# Patient Record
Sex: Male | Born: 1970 | Race: White | Hispanic: No | Marital: Married | Smoking: Current every day smoker
Health system: Southern US, Community
[De-identification: ages and names within clinical notes are randomized; demographics above are authoritative.]

## PROBLEM LIST (undated history)

## (undated) DIAGNOSIS — K219 Gastro-esophageal reflux disease without esophagitis: Secondary | ICD-10-CM

## (undated) DIAGNOSIS — E7800 Pure hypercholesterolemia, unspecified: Secondary | ICD-10-CM

## (undated) DIAGNOSIS — E119 Type 2 diabetes mellitus without complications: Secondary | ICD-10-CM

## (undated) DIAGNOSIS — I10 Essential (primary) hypertension: Secondary | ICD-10-CM

## (undated) DIAGNOSIS — I219 Acute myocardial infarction, unspecified: Secondary | ICD-10-CM

## (undated) HISTORY — PX: STENT PLACEMENT ILIAC (ARMC HX): HXRAD1735

---

## 1999-02-08 ENCOUNTER — Emergency Department (HOSPITAL_COMMUNITY): Admission: EM | Admit: 1999-02-08 | Discharge: 1999-02-08 | Payer: Self-pay | Admitting: Emergency Medicine

## 1999-02-08 ENCOUNTER — Encounter: Payer: Self-pay | Admitting: Emergency Medicine

## 1999-12-29 ENCOUNTER — Emergency Department (HOSPITAL_COMMUNITY): Admission: EM | Admit: 1999-12-29 | Discharge: 1999-12-29 | Payer: Self-pay | Admitting: Emergency Medicine

## 2005-11-18 ENCOUNTER — Emergency Department (HOSPITAL_COMMUNITY): Admission: EM | Admit: 2005-11-18 | Discharge: 2005-11-18 | Payer: Self-pay | Admitting: Family Medicine

## 2005-11-25 ENCOUNTER — Emergency Department (HOSPITAL_COMMUNITY): Admission: EM | Admit: 2005-11-25 | Discharge: 2005-11-25 | Payer: Self-pay | Admitting: Family Medicine

## 2006-02-10 ENCOUNTER — Ambulatory Visit: Payer: Self-pay | Admitting: Family Medicine

## 2006-02-10 ENCOUNTER — Inpatient Hospital Stay (HOSPITAL_COMMUNITY): Admission: EM | Admit: 2006-02-10 | Discharge: 2006-02-12 | Payer: Self-pay | Admitting: Emergency Medicine

## 2006-02-23 ENCOUNTER — Ambulatory Visit: Payer: Self-pay | Admitting: Sports Medicine

## 2006-02-25 ENCOUNTER — Ambulatory Visit: Payer: Self-pay | Admitting: Family Medicine

## 2006-06-18 ENCOUNTER — Emergency Department (HOSPITAL_COMMUNITY): Admission: EM | Admit: 2006-06-18 | Discharge: 2006-06-18 | Payer: Self-pay | Admitting: Family Medicine

## 2006-09-14 ENCOUNTER — Encounter (INDEPENDENT_AMBULATORY_CARE_PROVIDER_SITE_OTHER): Payer: Self-pay | Admitting: Family Medicine

## 2006-10-12 ENCOUNTER — Encounter (HOSPITAL_COMMUNITY): Admission: RE | Admit: 2006-10-12 | Discharge: 2006-10-19 | Payer: Self-pay | Admitting: Cardiology

## 2006-10-16 ENCOUNTER — Telehealth: Payer: Self-pay | Admitting: Family Medicine

## 2006-10-16 ENCOUNTER — Telehealth: Payer: Self-pay | Admitting: *Deleted

## 2006-10-16 ENCOUNTER — Ambulatory Visit: Payer: Self-pay | Admitting: Family Medicine

## 2006-10-16 DIAGNOSIS — K219 Gastro-esophageal reflux disease without esophagitis: Secondary | ICD-10-CM

## 2006-10-16 LAB — CONVERTED CEMR LAB: Rapid Strep: NEGATIVE

## 2006-10-21 ENCOUNTER — Telehealth: Payer: Self-pay | Admitting: *Deleted

## 2006-10-21 ENCOUNTER — Ambulatory Visit: Payer: Self-pay | Admitting: Family Medicine

## 2006-10-23 ENCOUNTER — Telehealth: Payer: Self-pay | Admitting: *Deleted

## 2006-10-26 ENCOUNTER — Encounter (INDEPENDENT_AMBULATORY_CARE_PROVIDER_SITE_OTHER): Payer: Self-pay | Admitting: Family Medicine

## 2006-10-27 ENCOUNTER — Ambulatory Visit (HOSPITAL_COMMUNITY): Admission: RE | Admit: 2006-10-27 | Discharge: 2006-10-28 | Payer: Self-pay | Admitting: Cardiology

## 2006-11-04 ENCOUNTER — Ambulatory Visit (HOSPITAL_COMMUNITY): Admission: RE | Admit: 2006-11-04 | Discharge: 2006-11-05 | Payer: Self-pay | Admitting: Interventional Cardiology

## 2006-11-16 ENCOUNTER — Encounter (INDEPENDENT_AMBULATORY_CARE_PROVIDER_SITE_OTHER): Payer: Self-pay | Admitting: Family Medicine

## 2006-11-17 ENCOUNTER — Inpatient Hospital Stay (HOSPITAL_COMMUNITY): Admission: AD | Admit: 2006-11-17 | Discharge: 2006-11-18 | Payer: Self-pay | Admitting: Cardiology

## 2006-11-30 ENCOUNTER — Inpatient Hospital Stay (HOSPITAL_COMMUNITY): Admission: AD | Admit: 2006-11-30 | Discharge: 2006-12-01 | Payer: Self-pay | Admitting: Cardiology

## 2006-12-03 ENCOUNTER — Encounter (INDEPENDENT_AMBULATORY_CARE_PROVIDER_SITE_OTHER): Payer: Self-pay | Admitting: Family Medicine

## 2006-12-22 ENCOUNTER — Telehealth (INDEPENDENT_AMBULATORY_CARE_PROVIDER_SITE_OTHER): Payer: Self-pay | Admitting: *Deleted

## 2006-12-22 ENCOUNTER — Ambulatory Visit: Payer: Self-pay | Admitting: Family Medicine

## 2006-12-25 ENCOUNTER — Encounter (INDEPENDENT_AMBULATORY_CARE_PROVIDER_SITE_OTHER): Payer: Self-pay | Admitting: Family Medicine

## 2006-12-28 ENCOUNTER — Encounter: Admission: RE | Admit: 2006-12-28 | Discharge: 2006-12-28 | Payer: Self-pay | Admitting: Cardiology

## 2007-01-18 ENCOUNTER — Encounter (INDEPENDENT_AMBULATORY_CARE_PROVIDER_SITE_OTHER): Payer: Self-pay | Admitting: Family Medicine

## 2007-01-19 ENCOUNTER — Encounter (INDEPENDENT_AMBULATORY_CARE_PROVIDER_SITE_OTHER): Payer: Self-pay | Admitting: Family Medicine

## 2007-01-19 DIAGNOSIS — E669 Obesity, unspecified: Secondary | ICD-10-CM | POA: Insufficient documentation

## 2007-03-15 ENCOUNTER — Encounter (INDEPENDENT_AMBULATORY_CARE_PROVIDER_SITE_OTHER): Payer: Self-pay | Admitting: Family Medicine

## 2007-04-17 ENCOUNTER — Emergency Department (HOSPITAL_COMMUNITY): Admission: EM | Admit: 2007-04-17 | Discharge: 2007-04-17 | Payer: Self-pay | Admitting: Emergency Medicine

## 2007-05-24 ENCOUNTER — Ambulatory Visit: Payer: Self-pay | Admitting: Sports Medicine

## 2007-05-24 ENCOUNTER — Encounter: Admission: RE | Admit: 2007-05-24 | Discharge: 2007-05-24 | Payer: Self-pay | Admitting: *Deleted

## 2007-05-24 ENCOUNTER — Encounter (INDEPENDENT_AMBULATORY_CARE_PROVIDER_SITE_OTHER): Payer: Self-pay | Admitting: *Deleted

## 2007-05-24 LAB — CONVERTED CEMR LAB
HCT: 38.9 % — ABNORMAL LOW (ref 39.0–52.0)
Lymphocytes Relative: 25 % (ref 12–46)
Lymphs Abs: 1.4 10*3/uL (ref 0.7–4.0)
MCV: 90.3 fL (ref 78.0–100.0)
Monocytes Relative: 7 % (ref 3–12)
Neutrophils Relative %: 64 % (ref 43–77)
Platelets: 194 10*3/uL (ref 150–400)
RBC: 4.31 M/uL (ref 4.22–5.81)
WBC: 5.6 10*3/uL (ref 4.0–10.5)

## 2007-09-13 ENCOUNTER — Telehealth (INDEPENDENT_AMBULATORY_CARE_PROVIDER_SITE_OTHER): Payer: Self-pay | Admitting: Family Medicine

## 2007-09-13 ENCOUNTER — Inpatient Hospital Stay (HOSPITAL_COMMUNITY): Admission: AD | Admit: 2007-09-13 | Discharge: 2007-09-18 | Payer: Self-pay | Admitting: Cardiology

## 2007-09-30 ENCOUNTER — Encounter (INDEPENDENT_AMBULATORY_CARE_PROVIDER_SITE_OTHER): Payer: Self-pay | Admitting: Family Medicine

## 2007-10-05 ENCOUNTER — Telehealth: Payer: Self-pay | Admitting: *Deleted

## 2007-10-06 ENCOUNTER — Ambulatory Visit: Payer: Self-pay | Admitting: Family Medicine

## 2007-10-06 DIAGNOSIS — I872 Venous insufficiency (chronic) (peripheral): Secondary | ICD-10-CM | POA: Insufficient documentation

## 2007-11-09 ENCOUNTER — Encounter: Payer: Self-pay | Admitting: Family Medicine

## 2007-12-08 ENCOUNTER — Encounter: Payer: Self-pay | Admitting: Family Medicine

## 2007-12-08 ENCOUNTER — Telehealth: Payer: Self-pay | Admitting: Family Medicine

## 2007-12-08 ENCOUNTER — Ambulatory Visit: Payer: Self-pay | Admitting: Family Medicine

## 2007-12-08 DIAGNOSIS — E785 Hyperlipidemia, unspecified: Secondary | ICD-10-CM

## 2007-12-08 DIAGNOSIS — I2510 Atherosclerotic heart disease of native coronary artery without angina pectoris: Secondary | ICD-10-CM | POA: Insufficient documentation

## 2007-12-08 DIAGNOSIS — I10 Essential (primary) hypertension: Secondary | ICD-10-CM | POA: Insufficient documentation

## 2007-12-08 DIAGNOSIS — I252 Old myocardial infarction: Secondary | ICD-10-CM

## 2007-12-08 LAB — CONVERTED CEMR LAB
BUN: 14 mg/dL (ref 6–23)
CO2: 26 meq/L (ref 19–32)
Glucose, Bld: 81 mg/dL (ref 70–99)
Potassium: 4.1 meq/L (ref 3.5–5.3)
Sodium: 142 meq/L (ref 135–145)

## 2007-12-13 ENCOUNTER — Telehealth: Payer: Self-pay | Admitting: *Deleted

## 2007-12-16 ENCOUNTER — Encounter: Payer: Self-pay | Admitting: Family Medicine

## 2007-12-17 ENCOUNTER — Telehealth: Payer: Self-pay | Admitting: *Deleted

## 2007-12-28 ENCOUNTER — Encounter: Payer: Self-pay | Admitting: Family Medicine

## 2008-02-02 ENCOUNTER — Encounter: Payer: Self-pay | Admitting: Family Medicine

## 2008-07-11 IMAGING — CR DG CHEST 2V
2 series · 2 of 2 positions shown · non-contrast
Comparison: 11/17/2006

Addendum Begins
There is a voice recognition error in the body of the report. The last sentence
in the body the report should state "Mild convex-right thoracic scoliosis is
stable."
Addendum Ends
CLINICAL DATA: Shortness of breath

[w chest pa]
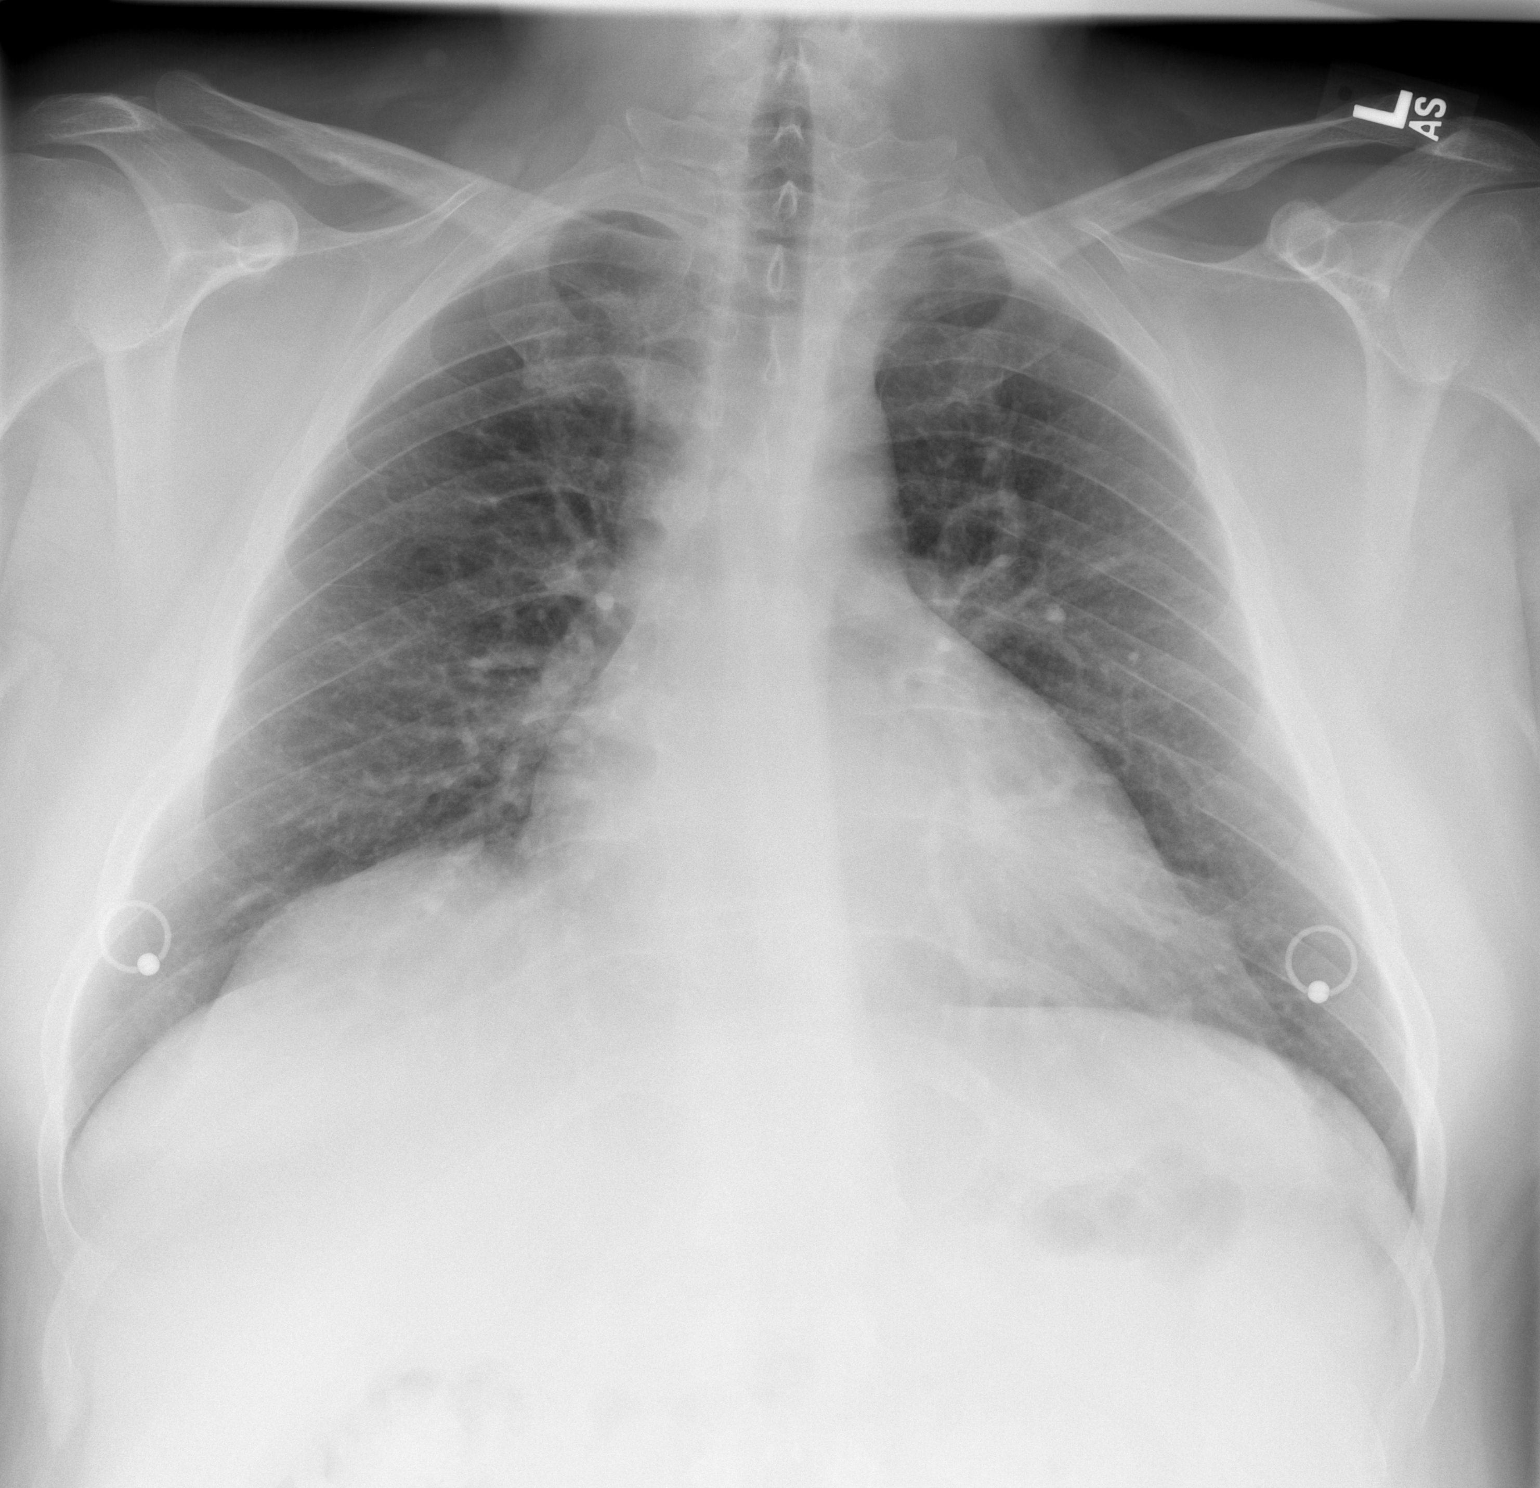

[w chest lat]
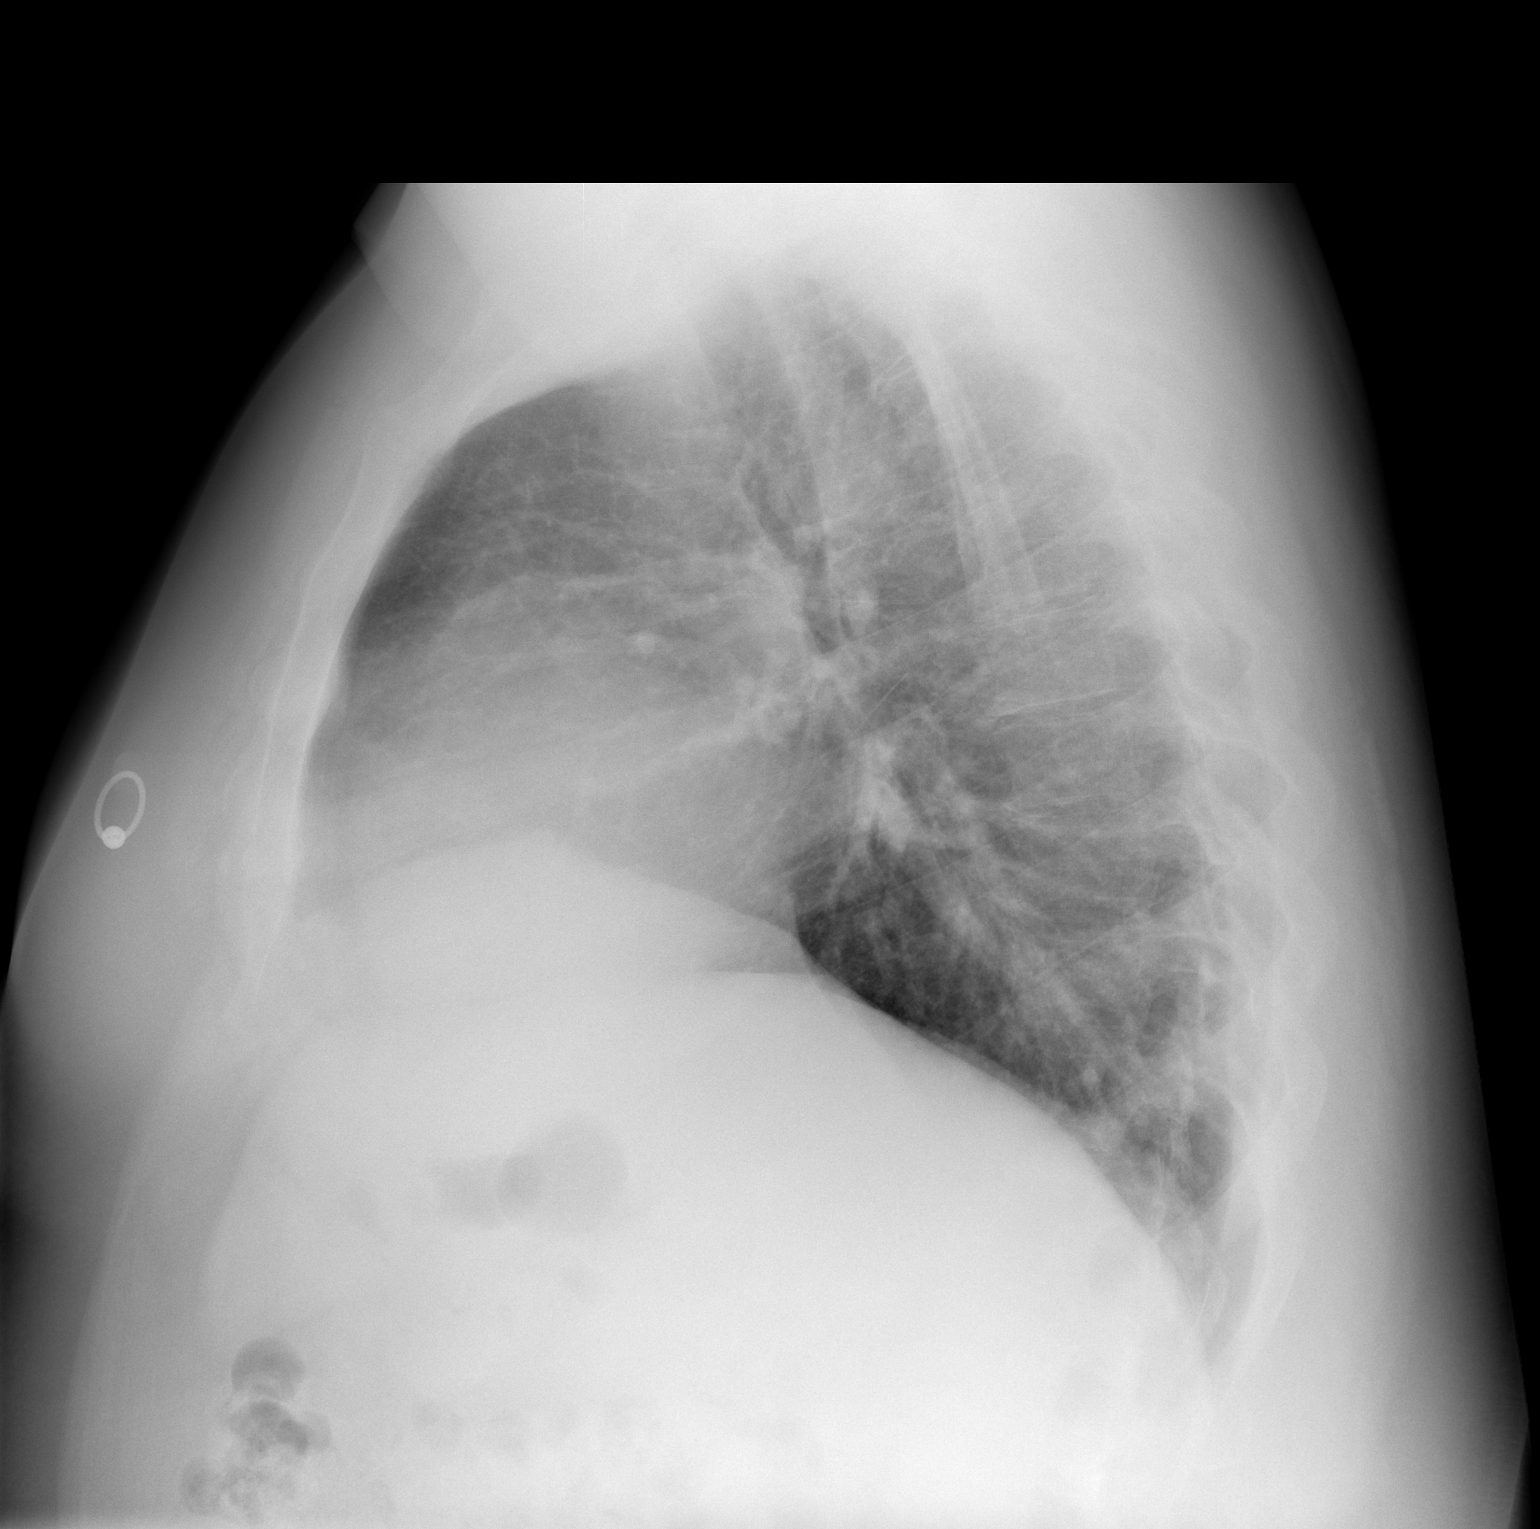

[2 of 2 positions shown; findings below may reference images not displayed]

CHEST - 2 VIEW:

   Lung volumes are low. Cardiopericardial silhouette is upper limits of normal
for size. Interstitial markings are coarsened. There is no pulmonary edema or
focal airspace consolidation. Marked convex-right thoracic scoliosis is stable.
IMPRESSION: No interval change. No acute cardiopulmonary findings.

## 2010-03-08 ENCOUNTER — Encounter: Payer: Self-pay | Admitting: Family Medicine

## 2010-06-04 NOTE — Miscellaneous (Signed)
  Clinical Lists Changes  Problems: Removed problem of LEG EDEMA, BILATERAL (ICD-782.3) Removed problem of FAMILY HISTORY DIABETES 1ST DEGREE RELATIVE (ICD-V18.0) Removed problem of ABSCESS, TOOTH (ICD-522.5) - Multiple infected teeth Removed problem of SCREENING FOR DIABETES MELLITUS (ICD-V77.1) Removed problem of SCREENING FOR THYROID DISORDER (ICD-V77.0) Removed problem of BLISTER, FOOT (ICD-917.2) Removed problem of ANGINA, STABLE (ICD-413.9) Removed problem of DYSLIPIDEMIA (ICD-272.4) Removed problem of HYPERTENSION, BENIGN (ICD-401.1) Removed problem of ORTHOSTATIC DIZZINESS (ICD-780.4) Removed problem of ATHEROSCLEROTIC CARDIOVASCULAR DISEASE (ICD-429.2)

## 2010-09-17 NOTE — H&P (Signed)
[name], Parker NO.:  [id number]   MEDICAL RECORD NO.:  [id number]          PATIENT TYPE:  INP   LOCATION:  2009                         FACILITY:  [facility]   PHYSICIAN:  Armanda Magic, M.D.     DATE OF BIRTH:  23-May-1970   DATE OF ADMISSION:  09/13/2007  DATE OF DISCHARGE:                              HISTORY & PHYSICAL   CHIEF COMPLAINT:  Chest pain.   HISTORY OF PRESENT ILLNESS:  [name] is a pleasant 40 year old  gentleman with known CAD status post prior stent implantations in the  past.  He states over the last week he has began experiencing more chest  tightness that is consistent with his usual angina symptoms.  He can  experience it at rest or with exertion.  It does not radiate.  He does  have some dyspnea with it but no sweats or nausea.  States episode can  last anywhere from 15 minutes to approximately 45 minutes.  Nitroglycerin usually relieves it.  The patient also states that in the  last few days he has not felt well in general.  He cannot give me  specifics regarding this.  He states that coworkers over the weekend  told him that he looked bad and advised him to go home and get some  rest.  In the office, the patient was experiencing some chest tightness  that had been present since earlier today.  He took a nitroglycerin, and  his symptoms went from a 5 to 0 to 1.  He is being admitted for unstable  angina.   PAST MEDICAL HISTORY:  1. Coronary artery disease, status post PTCA stent implantations to      the LAD, (a) non-ST elevated MI associated with above in October of      2007, (b) prior circumflex OM-2 stent December 11, 2006, (c) cardiac      catheterization for chest pain November 18, 2006 revealed widely patent      LAD stent, (d) EF 60-65%, echo November 16, 2006.  2. Hypertension.  3. Dyslipidemia.  4. Asymptomatic bradycardia.  5. Dyslipidemia.   PAST SURGICAL HISTORY:  Negative.   CURRENT MEDICATIONS:  1. Aspirin 325 mg  daily.  2. Lopressor 50 mg twice a day.  3. Nitroglycerin 0.4 mg sublingual p.r.n.  4. Isosorbide mononitrate 60 mg daily.  5. Lisinopril 10 mg a day.  6. Vytorin 10/40 one a day.  7. Plavix 75 mg a day.  8. Norvasc 5 mg daily.  9. Renexa 1000 mg twice a day.  10.Famotidine 20 mg twice a day.   ALLERGIES:  NO KNOWN DRUG ALLERGIES.   FAMILY HISTORY:  Noncontributory.   SOCIAL HISTORY:  Married with 2 children in good health.  Previous  smoker of 1-1/2 packs of cigarettes daily.  Works for [employer].   REVIEW OF SYSTEMS:  GENERAL:  No fever or chills, weight loss or gain.  No headaches or sinusitis.  EYES:  Normal vision.  No diplopia or  blurred vision.  EARS:  Hearing present, no tinnitus or earaches.  NOSE:  No obstruction, discharge, epistaxis.  RESPIRATORY:  No cough, sputum  production, hemoptysis, or hoarseness.  Positive shortness of breath as  above.  Denies PND, orthopnea.  CARDIOVASCULAR:  As above.  The pain is  not pleuritic.  ABDOMEN:  No heartburn, dysphagia, abdominal pain,  nausea, vomiting, diarrhea.  GU:  No dysuria, no frequency, hematuria,  nocturia.  HEMATOLOGIC:  No bleeding or bruising tendency or history of  frequent anemia.  MUSCULOSKELETAL:  No joint pain, stiffness, swelling  or deformity.  NEUROLOGIC:  No numbness, tingling, seizures, memory  loss, lightheadedness, dizziness.   PHYSICAL EXAMINATION:  VITAL SIGNS:  Weight is 279, pulse is 64, blood  pressure 110/80.  GENERAL:  Very pleasant gentleman in no acute distress.  NECK:  No JVD.  No adenopathy.  HEART:  Normal S1-S2 without murmur, S3-S4 rub, click or gallop.  LUNGS:  Clear to auscultation throughout.  ABDOMEN:  Obese.  Bowel sounds present.  No abdominal bruits.  No  tenderness.  GU/RECTAL:  Deferred.  EXTREMITIES:  MAE x4.  Positive peripheral pulses, +2 bilaterally.  No  clubbing, cyanosis or edema.   EKG reveals normal sinus bradycardia, rate at 56 beats per minute.  There are  no ST changes.   IMPRESSION:  1. Unstable angina.  2. Known coronary artery disease, status post stent implantations as      above.  3. Dyslipidemia.  4. Hypertension.  5. Obesity.   PLAN:  Admit to telemetry.  Check isoenzymes, troponin I.  Nitroglycerin  IV drip, titrate for pain control and n.p.o. after midnight for cardiac  catheterization in a.m.      Tamera C. Baruch Merl, M.D.  Electronically Signed    TCL/MEDQ  D:  09/13/2007  T:  09/13/2007  Job:  [id number]

## 2010-09-17 NOTE — Cardiovascular Report (Signed)
NAMEHYDEN, SOLEY NO.:  [id number]   MEDICAL RECORD NO.:  [id number]          PATIENT TYPE:  INP   LOCATION:  6525                         FACILITY:  [facility]   PHYSICIAN:  Armanda Magic, M.D.     DATE OF BIRTH:  [birth year]   DATE OF PROCEDURE:  11/17/2006  DATE OF DISCHARGE:                            CARDIAC CATHETERIZATION   PROCEDURE:  Left heart catheterization with coronary angiography.   OPERATOR:  Armanda Magic, M.D.   INDICATIONS:  Chest pain and coronary disease.   COMPLICATIONS:  None.   IV ACCESS:  Via right femoral artery, 6-French sheath.   HISTORY:  This a very pleasant, 40 year old white male who has a history  of non-ST-elevation MI in the past.  He is status post PTCA stenting of  the circumflex OM-II as well as the LAD and PDA.  He now presents  because of recurrent chest pain after undergoing PCI recently.   The patient was brought to cardiac catheterization laboratory in a  fasting, nonsedated state.  Informed consent was obtained.  The patient  was connected to continuous heart rate and pulse oximetry monitoring and  intermittent blood pressure monitoring.  The right groin was prepped and  draped in sterile fashion.  Xylocaine 1% was used for local anesthesia.  Using the modified Seldinger technique, a 6-French sheath was placed in  right femoral artery.  Under fluoroscopic guidance, a 6-French, JL-4  catheter was placed in left coronary artery.  Multiple cine films were  taken at 30-degree RAO and 40-degree LAO views.  His catheter was then  exchanged out over a guidewire for a 6-French, JR-4 catheter which was  placed in fluoroscopic guidance in right coronary artery.  Multiple cine  films were taken at 30-degree RAO and 40-degree LAO views.  His catheter  was then exchanged out over a guidewire and a 6-French angled pigtail  catheter was placed in the left ventricular cavity.  Left  ventriculography was not performed because the  patient had just been  catheterized recently, but left ventricular and aortic pressures were  measured.  The catheter was pulled back across the aortic valve.  At the  end of procedure, all catheters and sheaths were removed.  Manual  compression was performed until adequate hemostasis was obtained.  The  patient transferred back to room in stable condition.   RESULTS:  Left main coronary artery is widely patent and bifurcates into  the left anterior descending artery and left circumflex artery.  Left  anterior descending artery is widely patent throughout its course  proximally and gives rise to a first diagonal branch.  There is evidence  of a stent just distal to the first diagonal branch.  At the distal  portion of the stent, there appeared to be some spasm of the vessel  which resolved with intracoronary nitroglycerin that was given later in  the procedure by Dr. Eldridge Dace.  The ongoing LAD was widely patent and  traversed the apex.   The left circumflex had a stent in the proximal to midportion involving  the OM which was widely patent.  There  was a large OM that took off the  trifurcated in three daughter vessels.  The superior branch has a 90%  narrowing, but the vessel is very small and not amenable to PCI.  The  ongoing circumflex had a 50% narrowing, but otherwise patent.   The right coronary artery was widely patent except for the 50% eccentric  mid plaque on the ongoing RCA bifurcated, posterior descending artery  and posterolateral artery both of which are widely patent.   Left ventricular pressure 118/0 mmHg.  Aortic pressure 118/64 mmHg.  LVEDP 12 mmHg.   ASSESSMENT:  1. Three-vessel coronary disease.  2. Exertional angina.  3. Obesity.   PLAN:  Continue aspirin and Plavix.  We will plan on IVUS of the left  circumflex OM by Dr. Eldridge Dace to try and determine whether there is any  narrowing in the stent.  Will also increase the patient's Imdur to 90 mg  daily  because of his other obstructive disease and small vessels not  amenable to PCI.      Armanda Magic, M.D.  Electronically Signed     TT/MEDQ  D:  11/18/2006  T:  11/19/2006  Job:  [id number]

## 2010-09-17 NOTE — Cardiovascular Report (Signed)
NAMEABDOUL, ENCINAS NO.:  [id number]   MEDICAL RECORD NO.:  [id number]          PATIENT TYPE:  OIB   LOCATION:  6522                         FACILITY:  [facility]   PHYSICIAN:  Corky Crafts, MDDATE OF BIRTH:  July 27, 1970   DATE OF PROCEDURE:  DATE OF DISCHARGE:                            CARDIAC CATHETERIZATION   PRIMARY CARE PHYSICIAN:  Santiago Bumpers. Leveda Anna, M.D.   PROCEDURE:  PCI of the OM-2 and left circumflex.   OPERATOR:  Dr. Eldridge Dace.   INDICATIONS:  Unstable angina and abnormal stress test.   PROCEDURE:  The risks and benefits of PCI were explained to the patient  and informed consent was obtained.  The patient was brought to the cath  lab.  He was prepped and draped in the usual sterile fashion.  A 7-  French sheath was placed into his right femoral artery using the  modified Seldinger technique.  A CLS 3.5 guiding catheter was advanced  to the ascending aorta and into the left coronary artery.  A Prowater  wire was advanced down into the.  OM-2 and BMW wire was advanced into  the circumflex.  The 2-0 x 6 cutting balloon was placed into the left  circumflex after the takeoff of the OM2 and deployed at 8 atmospheres  for 73 seconds.  A 3-0 x 23 Cypher was then placed into the OM-2 with  part of the stent remaining in the circumflex.  The remainder of the  circumflex branch was covered by the Cypher stent.  The stent was  deployed at 18 atmospheres for 58 seconds.  A 2.25 x 12 mm Maverick  balloon was then placed over the BMW wire into the ostium of the  continuation of the circumflex.  A 2-0 x 12-mm Maverick was placed into  the OM-2 at that bifurcation.  Both balloons were inflated in a  simultaneous kissing fashion., the 2.25 balloon to 10 atmospheres for 32  seconds, the 2.0 balloon to 8 atmospheres for 32 seconds.  There was a  good angiographic result.  An IVUS was performed showing that the  diameter of the vessel was closer to 3.5 mm in  diameter.  A 3.5 x 50-mm  Quantum balloon was then placed within the stent and deployed at 16  atmospheres for 29 seconds in the distal part of the stent and then at  16 atmospheres for 17 seconds in the more proximal part of the stent.  There was a slight impingement of the ostium of the remainder of the  circumflex.  A 2.25 x 12-mm Maverick was again placed into the unstented  portion of the circumflex and the balloon was inflated at 10 atmospheres  for 30 seconds.  There is an excellent angiographic result.   RESULTS:  There was no residual stenosis within the stent.  There was a  10% residual stenosis in the angioplasty area in the circumflex.   IMPRESSION:  1. Successful PCI of the OM-2 and circumflex with a drug-eluting      stent.  This was a 3-0 x 23 Cypher which was post-dilated at 3.5 mm  in diameter after IVUS guidance.   RECOMMENDATIONS:  Will watch overnight.  He should continue aspirin and  Plavix indefinitely given that he has two drug-eluting stents.  Also,  there is an ectatic segment just distal to the OM-2 stent.  Stent  apposition within this ectatic area is  always difficult to achieve.  Therefore, I would stress the importance of lifelong Plavix.      Corky Crafts, MD  Electronically Signed     JSV/MEDQ  D:  11/04/2006  T:  11/04/2006  Job:  [id number]

## 2010-09-17 NOTE — Cardiovascular Report (Signed)
[name], [name] NO.:  [id number]   MEDICAL RECORD NO.:  [id number]          PATIENT TYPE:  INP   LOCATION:  2009                         FACILITY:  [facility]   PHYSICIAN:  Armanda Magic, M.D.     DATE OF BIRTH:  December 22, 1970   DATE OF PROCEDURE:  09/14/2007  DATE OF DISCHARGE:                            CARDIAC CATHETERIZATION   PROCEDURE:  1. Left heart catheterization.  2. Coronary angiography.  3. Left ventriculography.   OPERATOR:  Armanda Magic, MD   INDICATIONS:  1. Unstable angina pectoris.  2. Coronary artery disease.   COMPLICATIONS:  None.   IV access via right femoral artery 6-French sheath.   INTRAVENOUS MEDICATIONS:  1. Versed 2 mg.  2. Fentanyl 25 mcg IV.  3. A 400 mcg of intracoronary nitroglycerin.   This is a 40 year old morbidly obese male who has a history of coronary  artery disease status post PTCA stenting of the LAD, who now presents  for cardiac catheterization because of unstable angina.   The patient was brought to the cardiac catheterization laboratory in a  fasting, nonsedated state.  Informed consent was obtained.  The patient  was connected to continuous heart rate and pulse oximetry monitoring and  blood pressure monitoring.  The right groin was prepped and draped in a  sterile fashion.  A 1% Xylocaine was used for local anesthesia.  Using  modified Seldinger technique, a 6-French sheath was placed in the right  femoral artery.  Under fluoroscopic guidance, a 6-French JL-4 catheter  was placed in the left coronary artery.  Multiple cine films were taken  at 30-degree RAO and 40-degree LAO views.  This catheter was then  exchanged out over a guidewire for 6-French JR-4 catheter, which was  placed under fluoroscopic guidance in the right coronary artery.  Multiple cine films were taken at 30-degree RAO and 40-degree LAO views.  This catheter was then exchanged out over a guidewire for a 6-French  angled pigtail catheter,  which was placed under fluoroscopic guidance in  the left ventricular cavity.  Left ventriculography was performed in 30-  degree RAO view using a total of 30 milliliters of contrast at 15  milliliters per second.  The catheter was then pulled back across the  aortic valve with no significant gradient noted.  At the end of the  procedure, all catheters and sheaths were removed.  Manual compression  was performed.  Adequate hemostasis was obtained.  The patient was  transferred back to the room in stable condition.   RESULTS:  Left main coronary is widely patent and bifurcates into the  left anterior descending artery and left circumflex artery.   Left anterior descending artery is widely patent proximally with a stent  in the proximal portion.  In the midportion of the stent, a diagonal  takes off, which is widely patent.  Just distal to the end of the stent  is a 50% narrowing of the LAD.  The ongoing LAD traverses the apex and  is widely patent.   Left circumflex is widely patent throughout its course proximally giving  rise to  a first very large obtuse marginal branch.  The obtuse marginal  branch trifurcates into three daughter branches.  There is an 80-90%  stenosis in the superior branch, which is too small for PCI.  The mid  and inferior branches are widely patent.  The ongoing circumflex has a  70% narrowing.   The right coronary artery had at least a 50% narrowing with some spasm,  this somewhat improved after 400 mcg of intracoronary nitroglycerin.  There was also a 50-60% mid stenosis.  The vessel bifurcated distally  into the posterior descending artery and posterior lateral artery.  The  posterior lateral artery appeared diffusely diseased, but too small for  PCI.   The left ventriculography showed normal LV function, EF 60%, left  ventricular pressure 107/12 mmHg, aortic pressure 105/69 mmHg, and LVEDP  is 17 mmHg.   ASSESSMENT:  1. One-vessel branch obstructive  disease, not amenable to percutaneous      coronary intervention secondary to decreased size of the vessel,      borderline obstructive disease of the right coronary artery and      distal left circumflex.  2. Normal left ventricular function.  3. Chest pain.   PLAN:  Films were reviewed with Dr. Eldridge Dace.  Given that there is  borderline obstructive disease of the RCA and the circumflex, we will  recommend noninvasive ischemic evaluation to identify any areas of  ischemia in the left circumflex or RCA territories.      Armanda Magic, M.D.  Electronically Signed     TT/MEDQ  D:  09/16/2007  T:  09/17/2007  Job:  [id number]

## 2010-09-17 NOTE — Discharge Summary (Signed)
NAMEBELVIN, GAUSS NO.:  [id number]   MEDICAL RECORD NO.:  [id number]          PATIENT TYPE:  OIB   LOCATION:  6522                         FACILITY:  [facility]   PHYSICIAN:  [name], P.A.       DATE OF BIRTH:  09-18-1970   DATE OF ADMISSION:  11/04/2006  DATE OF DISCHARGE:  11/05/2006                               DISCHARGE SUMMARY   DISCHARGE DIAGNOSES:  1. Coronary artery disease, status post drug-eluting stent to the      second obtuse marginal artery/circumflex.  2. Known coronary artery disease, status post non-ST elevation      myocardial infarction with percutaneous transluminal coronary      angioplasty/stenting of the ruptured plaque of the left anterior      descending in October of 2007.  3. Dyslipidemia, treated.  4. Obesity.  5. Hypertension.  6. Long-term medication use.   [name] is a 40 year old male patient with known history of coronary  artery disease, as described above.  He presented recently with  recurrent chest pain and underwent a stress Cardiolite study which  showed a reversible defect in the anterior wall.  For this reason, he  was scheduled to come back in for cardiac catheterization.   On October 27, 2006, he underwent cardiac catheterization.  This showed a  60% RCA stenosis that was injected with intracoronary nitroglycerin,  with good result.  There was 70% stenosis of the circumflex, followed by  90% stenosis, in-stent, of the LAD.  At that point, Dr. Eldridge Dace then  placed a drug-eluting stent into the LAD region without difficulty.   The patient was then brought back in yesterday and underwent  percutaneous intervention using a drug-eluting stent to the OM-2  injected into the circumflex.  The stent was deployed without problem  and the patient remained in the hospital overnight.   LAB STUDIES UPON DISCHARGE:  BUN 11, creatinine 0.84, hemoglobin 12.1,  hematocrit 35.1, potassium 4.2.   Vital signs were stable.  He  remains afebrile.  His right groin has  minimal ecchymosis distal to the puncture site with no bruit; palpable  PT pulses.   DISCHARGE MEDICATIONS:  1. Enteric-coated aspirin 325 mg a day.  2. Plavix 75 mg a day.  3. Sublingual nitroglycerin p.r.n. chest pain.  4. Lopressor 75 mg twice a day.  5. Multivitamin daily.  6. Imdur 30 mg a day.  7. Lisinopril 10 mg a day.  8. Vytorin one tablet daily.  9. Protonix 40 mg a day.   Clean cath site gently with soap and water; no scrubbing.  Low-sodium,  heart-healthy diet.  Increase activity slowly.  May shower and bathe.  No lifting over ten pounds for one week.  No driving for two days.  Return to work November 12, 2006.  Follow up with Dr. Mayford Knife on November 20, 2006, at 12:30 p.m.      [name], P.A.     LB/MEDQ  D:  11/05/2006  T:  11/05/2006  Job:  [id number]   cc:   Armanda Magic, M.D.  William A. Leveda Anna, M.D.

## 2010-09-17 NOTE — Cardiovascular Report (Signed)
[name], [name] NO.:  [id number]   MEDICAL RECORD NO.:  [id number]          PATIENT TYPE:  INP   LOCATION:  6525                         FACILITY:  [facility]   PHYSICIAN:  Corky Crafts, MDDATE OF BIRTH:  1970-08-26   DATE OF PROCEDURE:  11/18/2006  DATE OF DISCHARGE:  11/18/2006                            CARDIAC CATHETERIZATION   PROCEDURES PERFORMED:  1. IVUS to the LAD.  2. IVUS to the left circumflex and OM-2.  3. PTCA of the OM-2.   OPERATOR:  Corky Crafts, MD   INDICATIONS:  Angina.   PROCEDURE NARRATIVE:  The diagnostic catheterization was performed by  Dr. Mayford Knife.  The patient had had recurrent pain after stent placement 2  weeks ago.  An ACLS 3.5 guiding catheter was placed in the ostium of the  left main.  A Prowater wire was placed down the LAD and an IVUS catheter  was placed in the LAD; and a pullback was performed.  The Prowater wire  was then directed down into the left circumflex.  An OM-2 and IVUS  catheter was advanced.  The IVUS catheter was pulled back, then the PTCA  was performed.  The Prowater wire was kept in the OM-2.  A 3.75 x 12-mm  Quantum balloon was attempted to be placed within the OM-2.  It could  not navigate the proximal edge of the circ stent.   A mailman wire was then put down the vessel; and the balloon was placed  over this wire without difficulty.  Several inflations were performed  within the OM-2 stent, in the distal portion of the stent, the balloon  was inflated to 14 atmospheres for 23 seconds, then the balloon was  withdrawn proximally and deployed at 12 atmospheres for 14 seconds, 14  atmospheres for 23 seconds, 16 atmospheres for 21 seconds, and 14  atmospheres for 23 seconds.  There was no residual stenosis.  There was  a 40% stenosis in the residual circumflex vessel.  The IVUS catheter was  placed back into the OM-2 stent.  The stent appeared well opposed; and  been much better deployed.   IMPRESSION:  1. Widely patent LAD stent.  2. An under deployed previous circumflex/OM-2 stent.  This was not      flow limiting; however, the angioplasty was performed to minimize      the chance of restenosis in the future.  3. There is small vessel disease in other branch territories.   RECOMMENDATIONS:  The patient will be watched overnight.  Angiomax was  used for anticoagulation.  He will continue aspirin and Plavix along  with aggressive medical therapy.     Corky Crafts, MD  Electronically Signed    JSV/MEDQ  D:  11/17/2006  T:  11/18/2006  Job:  [id number]

## 2010-09-17 NOTE — Discharge Summary (Signed)
[name], [name] NO.:  [id number]   MEDICAL RECORD NO.:  [id number]          PATIENT TYPE:  OIB   LOCATION:  6522                         FACILITY:  [facility]   PHYSICIAN:  [name], P.A.       DATE OF BIRTH:  02/01/1971   DATE OF ADMISSION:  10/27/2006  DATE OF DISCHARGE:  10/28/2006                               DISCHARGE SUMMARY   DISCHARGE DIAGNOSES:  1. Coronary artery disease.  Status post drug eluting stent to the      LAD.  2. Residual coronary artery disease to his circumflex, scheduled for      percutaneous intervention next week.  3. Dyslipidemia.  Treated.  4. Hypertension.  Treated.  5. Obesity.  Treated.  6. Long-term medication use.   [name] is a 40 year old male patient who suffered a non-ST elevated  myocardial infarction in the recent past and underwent bare metal stent  placement to the LAD.  He presented to the office on Sep 14, 2006 for  follow up and had explained that he had had some chest pain.  Cardiolite  was performed and there was mild apical thinning with normal wall motion  of the area.  The patient was felt to have a normal Cardiolite study,  and because of his known disease, he was set up for cardiac  catheterization.   He underwent cardiac catheterization on October 27, 2006 under the care of  Dr. Armanda Magic.  It appears that in the right coronary artery he had a  60 to 70% proximal stenosis relieved with intracoronary nitroglycerin.  There was a 70% stenosis of the circumflex followed by a 90% stenosis,  in-stent, of the LAD.  Dr. Eldridge Dace then placed a drug eluting stent  into the LAD region without difficulty.   The patient tolerated the procedure well and is discharged to home in  stable and improved condition.  He will be brought back next week for  intervention to his circumflex lesion.   LAB STUDIES:  BUN 10, creatinine 0.82.  Hemoglobin 11.6, hematocrit  33.5.  Hemoglobin A1c 5.8.  Total cholesterol 131,  triglycerides 154,  LDL 63, HDL 35.   DISCHARGE MEDICATIONS:  1. Enteric-coated aspirin 325 mg a day.  2. Plavix 75 mg a day.  3. Sublingual nitroglycerin p.r.n. chest pain.  4. Lopressor 75 mg p.o. b.i.d.  5. Imdur 30 mg a day.  6. Lisinopril 10 mg a day.  7. Vytorin 1 tablet daily as prior to admission.  8. Protonix 40 mg a day.   Remain on a low-sodium heart healthy diet.  Clean cath site gently with  soap and water.  No scrubbing.  No lifting for 1 week over 10 pounds.  No driving over the next 2 days.  Increase activity slowly.  Brandy in  our office will call the patient to set up the percutaneous intervention  next week under the direction of Dr. Everette Rank.      [name], P.A.     LB/MEDQ  D:  10/28/2006  T:  10/28/2006  Job:  [id number]   cc:  Armanda Magic, M.D.  Corky Crafts, MD

## 2010-09-17 NOTE — Cardiovascular Report (Signed)
NAMEJOHNATHON, OLDEN NO.:  [id number]   MEDICAL RECORD NO.:  [id number]          PATIENT TYPE:  OIB   LOCATION:  6522                         FACILITY:  [facility]   PHYSICIAN:  Corky Crafts, MDDATE OF BIRTH:  1971/01/13   DATE OF PROCEDURE:  10/27/2006  DATE OF DISCHARGE:                            CARDIAC CATHETERIZATION   REFERRING PHYSICIAN:  Dr. Barth Kirks.   PROCEDURES PERFORMED:  PCI of the LAD, IVUS of the LAD.   OPERATORS:  Dr. Eldridge Dace.   INDICATIONS:  Abnormal stress test and angina.   FINDINGS:  The diagnostic cath was performed by Dr. Armanda Magic showing  a 90% diffuse in-stent restenosis of a prior LAD stent.  The patient had  already been consented for intervention.  A CLS 3.5 guiding catheter was  used to engage the left main.  Prowater wire was placed down the LAD.  A  2.5 x 15-mm Maverick balloon was then placed across the lesion and  deployed initially at 6 atmospheres for 16 seconds, 10 atmospheres, then  again at 10 atmospheres for 27 seconds and again 8 atmospheres for 11  seconds.  Balloon was withdrawn.  A 2.75 x 33-mm Cypher stent was then  deployed across the lesion at 12 atmospheres for 30 seconds.  The stent  was postdilated with a 3.0 x 20-mm Maverick balloon to 18 atmospheres  for 31 seconds and then again 18 atmospheres at 22 seconds.  An IVUS was  performed which showed apparently slight under deployment of the stent  distally and proximally.  A 325 x 20-mm Quantum Maverick balloon was  placed within the stent and inflated to 16 atmospheres for 25 seconds.  It was withdrawn to the proximal part of the vessel and deployed at 16  atmospheres for 18 seconds.   IMPRESSION:  1. Excellent angiographic result with a 2.75 x 33-mm Cypher stent      postdilated with a 3.25-mm balloon.  2. TIMI III flow remained.  The stenosis went from 90% to 0%.   RECOMMENDATIONS:  1. The patient does have a lesion in the circumflex.  We  will address      this in a few weeks.  2. Of note, during the procedure, we also performed diagnostic      angiography of the right coronary artery with a 4-French catheter.      There did not appear to be any significant stenosis and there was      no damping of the catheter.  I believe that there is not a lesion      which is hemodynamically significant in the ostium of the right      coronary artery.      Corky Crafts, MD  Electronically Signed     JSV/MEDQ  D:  10/27/2006  T:  10/28/2006  Job:  [id number]

## 2010-09-17 NOTE — Discharge Summary (Signed)
NAMESHIMSHON, NARULA NO.:  [id number]   MEDICAL RECORD NO.:  [id number]          PATIENT TYPE:  INP   LOCATION:  6525                         FACILITY:  [facility]   PHYSICIAN:  [name], P.A.       DATE OF BIRTH:  03-28-1971   DATE OF ADMISSION:  11/17/2006  DATE OF DISCHARGE:  11/18/2006                               DISCHARGE SUMMARY   DISCHARGE DIAGNOSES:  1. Coronary artery disease status post SPI of the undeployed left      circumflex stent status post successful left dilatation of that      area performed by Dr. Eldridge Dace, under the guidance of IVUS.  2. Known coronary artery disease.  3. Dyslipidemia.  4. Obesity.  5. Hypertension.  6. Long term medication use.   Mr. Stief is an unfortunate patient with multiple known risk factors  of coronary artery disease.  Recently he had undergone stenting to the  second OM circumflex lesions.  He has also had an IVUS stent, elevated  myocardial infarction with PTCA/stenting of the ruptured plaque that  went to the LAD.  He continued to have chest discomfort as well back to  the lab on November 17, 2006, and was found to have a patent LAD stent.  However, IVUS is in the left circumflex and OM2.  She had a wire stent,  however, somewhat underdilated.  Angioplasty was then performed with  IVUS showing better deployment.  The patient will need to be placed on  aspirin and Plavix indefinitely.   The patient was hospitalized overnight and on discharge, the BUN was 9,  creatinine 0.8.  Today he was discharged to home in stable and improved  condition.   His medications are as follows:  1. Im-Dur 90 mg, he is to take 1-1/2 60 mg tablets daily.  2. Plavix 75 mg a day.  3. Enteric coated aspirin 325 mg a day.  4. Lopressor 75 mg daily.  5. Lisinopril 10 mg a daily.  6. Vytorin daily.  7. Nexium 40 mg a day.  8. Norvasc 1 tablet daily as prior to admission.  9. Sublingual nitroglycerin p.r.n. chest pain.   Heart  healthy diet.   Clean cath site gently with soap and water, no scrubbing.   Increase the activity slowly.   May shower, may bathe.  No lifting over 10 pounds for 1 week.  No  driving for 2 days.      [name], P.A.     LB/MEDQ  D:  11/18/2006  T:  11/18/2006  Job:  [id number]   cc:   Armanda Magic, M.D.  William A. Leveda Anna, M.D.

## 2010-09-20 NOTE — Cardiovascular Report (Signed)
NAMEKIEL, COCKERELL NO.:  [id number]   MEDICAL RECORD NO.:  [id number]          PATIENT TYPE:  INP   LOCATION:  6527                         FACILITY:  [facility]   PHYSICIAN:  Lyn Records, M.D.   DATE OF BIRTH:  1970/12/09   DATE OF PROCEDURE:  02/11/2006  DATE OF DISCHARGE:  02/12/2006                              CARDIAC CATHETERIZATION   INDICATIONS:  Acute coronary syndrome in a 40 year old gentleman who is  adopted and does not know his family history.  He has risk factors for  coronary disease including a positive smoking history and hyperlipidemia.   PROCEDURES PERFORMED:  1. Left heart catheterization.  2. Selective coronary angiography.  3. Left ventriculography.  4. Bare metal stent implantation in the mid-LAD.   DESCRIPTION:  After informed consent, a 6-French sheath was placed in the  right femoral artery using a modified Seldinger technique.  A 6-French A2  multipurpose catheter was then used for hemodynamic recordings, left  ventriculography by hand injection, and attempted left and right coronary  angiography.  Ultimately #4 6-French left and right Judkins catheters were  used for coronary angiography.  After reviewing the digital coronary  angiographies, it was felt that the mid LAD contained a thrombus-containing  high-grade stenosis.  The patient had multiple other regions of obstruction  in his coronary tree including the first diagonal, circumflex marginals and  the right coronary.   After discussing the findings with the patient we decided to proceed with  stenting of the left coronary in the mid-LAD.  We used a 600 mg load of  Plavix and a bolus followed by an infusion of Angiomax.  ACT was documented  to be greater than 300 seconds.  We used a XB 6-French 3.5 left coronary  guide catheter an Clinical cytogeneticist guidewire.  We direct stented using a  Multilink Vision 3 x 15 mm stent.  We pulse dilated with a 3.25 mm x 12 mm  Quantum  balloon.  Each balloon inflation reproduced the patient's presenting  symptoms.  TIMI grade III flow was noted post procedure. No chest discomfort  or complications occurred.  The entry site of the sheath was too low to  perform closure with Angio-Seal, and manual compression will be used.   RESULTS:  1. Hemodynamic data:      a.     Aortic pressure 142/89.      b.     Left ventricular pressure 142/16  2. Left ventriculography:  The left ventricle is normal in size, demonstrates normal contractility.  EF  is 65%.  No mitral regurgitation.  1. Coronary angiography.      a.     Left main coronary:  The left main is short. No significant       obstruction is noted.      b.     Left anterior descending coronary:  The LAD is a large vessel       that wraps around to the left ventricular apex.  It contains multiple       luminal irregularities starting in the very proximal vessel and  giving       origin to a moderate-sized first diagonal before the first septal       perforator.  After the first septal perforator, there is a ruptured       plaque in the LAD that contains some thrombus and is at least 80%       obstructed.  There are multiple other regions of irregularity       throughout the mid and distal LAD.  The first diagonal contains 75-85%       mid to distal obstruction.      c.     Circumflex artery:  Circumflex coronary artery is a large vessel       that gives origin to a small first obtuse marginal and a large       trifurcating second obtuse marginal and a smaller third obtuse       marginal.  The second obtuse marginal is a dominant circumflex vessel.       The proximal portion of this vessel contains a 50-60% obstruction.       Ectasia is noted in the mid vessel and the first branch in the       trifurcation contains a 90% obstruction.  The continuation of the       circumflex beyond the second obtuse marginal contains eccentric 40-50%       obstruction.  The left atrial  recurrent branch then arises, and a       smaller third obtuse marginal branch arises.  It is free of       significant obstruction.      d.     Right coronary artery:  The right coronary artery is a dominant       vessel that gives origin to a PDA branch. The right coronary artery       contains proximal eccentric 60% obstruction. Multiple luminal       irregularities are noted throughout the mid vessel.  Distally there is       40-50% obstruction before the PDA and continuation.  IV:  PCI:  The LAD contained a thrombus filling greater than 80% stenosis in the mid  vessel with TIMI grade III flow.   Following PCI with a bare metal Multilink Vision stent, the stenosis was  reduced to 0% with TIMI grade III flow.   CONCLUSIONS:  1. Acute coronary syndrome secondary to plaque rupture in the left      anterior descending with reduction in 80% stenosis to 0% with TIMI      grade III flow following stenting with a bare metal stent.  2. Diffuse coronary disease with 40-50% obstruction in the proximal and      mid left anterior descending, high-grade obstruction in the mid first      diagonal, moderate obstruction in the mid circumflex, 90% obstruction      in a small branch of the second obtuse marginal and diffuse 50 and 60%      obstructions in the proximal and mid right coronary artery.  3. Normal left ventricular function.   PLAN:  1. Aspirin.  2. Aggressive risk factor modification.  3. Plavix continuously daily for 4 weeks.  4. Further cardiac management per family practice service and Dr. Mayford Knife.      Lyn Records, M.D.  Electronically Signed     HWS/MEDQ  D:  02/11/2006  T:  02/12/2006  Job:  [id number]   cc:   Barth Kirks, M.D.  Armanda Magic, M.D.

## 2010-09-20 NOTE — Discharge Summary (Signed)
[name], [name] NO.:  [id number]   MEDICAL RECORD NO.:  [id number]          PATIENT TYPE:  INP   LOCATION:  6533                         FACILITY:  [facility]   PHYSICIAN:  Armanda Magic, M.D.     DATE OF BIRTH:  12-14-1970   DATE OF ADMISSION:  09/13/2007  DATE OF DISCHARGE:  09/18/2007                               DISCHARGE SUMMARY   DIAGNOSES:  1. Chest pain resolved.  2. Coronary artery disease status post drug-eluting stent to the right      coronary artery.  3. Obesity.  4. Hyperlipidemia treated, hypertension treated.   HOSPITAL COURSE:  [name] is a 40 year old male patient who  complains of increasing chest pain, shortness of breath over the week  prior to admission.  He stated his chest tightness is consistent with  his usual angina.  He has known coronary artery disease and underwent  stent placement to the LAD in the setting with non-ST-segment elevated  myocardial infarction in the past.  He also had a drug-eluting stent to  the second obtuse marginal.   He was admitted to the hospital on Sep 13, 2007, with unstable angina.  He ruled out for a myocardial infarction with enzymes.  Because of his  risk factors and complaints, we did go head and perform a cardiac  catheterization.  Catheterization showed patent LAD stent with a 50%  stenosis just after the stent.  There was a 60% mid circumflex stenosis  with a small obtuse marginal branch with a 90% lesion that was felt to  be too small for intervention.  Right coronary artery had 50% ostial  lesion (spasm) then a 50% mid stenosis.   Because of these findings, it was unclear which vessel was causing the  patient's symptoms.  We did perform a stress Cardiolite to determine  exactly which area was causing his chest pain.  The stress Cardiolite  was reviewed by Dr. Mayford Knife and there was some ischemia in the mild  inferior lateral wall apex.   On Sep 17, 2007, the patient underwent  intravascular ultrasound  procedure of the right coronary artery by Dr. Eldridge Dace.  He felt that  with these findings, the patient required PROMUS stent to both the mid  and proximal right coronary artery.  The patient remained in the  hospital overnight and was discharged to home on the following day in  stable condition.   Lab studies during the patient's hospital stay included white count 5.4,  hemoglobin 13.1, hematocrit 37.0, platelets 173, sodium 139, potassium  4.0, BUN 13, creatinine 1.07.  LFTs normal cardiac isoenzymes negative.  BNP less than 30   DISCHARGE MEDICATIONS:  1. Norvasc 5 mg a day.  2. Enteric-coated aspirin 325 mg a day.  3. Lopressor 50 mg p.o. b.i.d.  4. Lisinopril 10 mg a day.  5. Imdur 60 mg a day.  6. Vytorin 10/40 mg a day.  7. Plavix 75 mg a day, indefinitely.  8. Ranexa 1000 mg twice a day.  9. Sublingual nitroglycerin p.r.n. chest pain.   The patient is to clean cath site gently  with soap and water.  No  scrubbing.  Remain on a low-sodium heart-healthy diet.  No lifting over  10 pounds for 1 week.  No driving for 2 days.  Follow up with Dr. Carolanne Grumbling on Sep 30, 2007, at 9:45.      [name], P.A.      Armanda Magic, M.D.  Electronically Signed    LB/MEDQ  D:  10/15/2007  T:  10/16/2007  Job:  [id number]   cc:   Armanda Magic, M.D.

## 2010-09-20 NOTE — Discharge Summary (Signed)
[name], [name] NO.:  [id number]   MEDICAL RECORD NO.:  [id number]          PATIENT TYPE:  INP   LOCATION:  6527                         FACILITY:  [facility]   PHYSICIAN:  [name], William ParkerDATE OF BIRTH:  November 26, 1970   DATE OF ADMISSION:  02/10/2006  DATE OF DISCHARGE:  02/12/2006                                 DISCHARGE SUMMARY   CONSULTATIONS:  [facility].   PROCEDURES:  1. On February 10, 2006 an EKG that showed bradycardia, wide QRS complex,      flipped T-waves, no ST elevation, right ventricular hypertrophy and no      P-waves.  2. On February 10, 2006 a chest x-ray that showed mild basilar atelectasis.  3. On February 11, 2006 a chest x-ray showed minimal basilar atelectasis.  4. On February 11, 2006 an EKG showed sinus bradycardia with Ps for every      QRS complex and QRS and T-waves within normal limits.  5. On February 11, 2006 a cardiac catheterization showed a normal left      ventricle with an ejection fraction of 65% and extensive coronary      artery disease with a left anterior descending artery 40% occluded      proximally, 90% occluded in the mid vessel with a ruptured plaque.  The      circumflex was large, trifurcating, and was 90% occluded in the distal      branch.  The mid circumflex was approximately 40% occluded.  The right      coronary artery was 50% to 60% occluded proximally and 50% occluded      distally.  A bare-metal stent was placed in the LAD in the mid portion      that was 90% occluded with a ruptured plaque and the vessel was 0%      occluded after the procedure.   REASON FOR ADMISSION:  Chest pain and shortness of breath with a workup to  evaluated possible stable angina versus myocardial infarction.   PRIMARY DISCHARGE DIAGNOSIS:  Acute coronary syndrome resulting from severe  coronary artery disease and a mid vessel, left anterior descending artery  ruptured plaque.   SECONDARY DISCHARGE DIAGNOSES:  1.  Hypertension, newly discovered and treated.  2. Hypercholesterolemia, newly discovered and treated.  3. Tobacco abuse with a patient saying he will quit immediately.   DISCHARGE MEDICATIONS:  1. Coated aspirin 325 mg once per day.  2. Plavix 75 mg once per day for 4 weeks.  3. Lipitor 40 mg once per day.  4. Lopressor 25 mg twice per day.  5. Nitroglycerin as needed for chest pain.  6. All medications are new.   DISPOSITION:  The patient is discharged home with counseling to make extreme  lifestyle changes and see a primary care [name] and cardiologist  routinely.   FOLLOWUP APPOINTMENTS:  Dr. Carolanne Grumbling, cardiologist at [address] on March 03, 2006 at 2:30 p.m. and Dr. Barth Parker with [facility] on February 23, 2006 at 1:45 p.m.   FOLLOWUP ISSUES:  The patient is required  to clean to the catheter site with  soap and water.  Primary care [name] will aid with tobacco cessation and  diet and exercise plan.   CONDITION ON DISCHARGE:  The patient is stable and improved.   LABORATORY DATA:  Hemoglobin, on admission, 17.  Hemoglobin, on discharge,  14.8 with an unknown baseline.  Creatinine, on admission, 1.  Creatinine, on  discharge, 0.8 with an unknown baseline.  Thyroid stimulating hormone 4.35.  Hemoglobin A1c 5.1.  Cardiac markers as follows:  CK 178, 267, 228.  CK-MB  18.9, 31.8, 12.7.  Troponin I 0.46, 1.96, 3.57.  Fasting lipid panel:  Cholesterol 233, triglyceride 232, HDL 38, LDL 149, VLDL 46.   PENDING LAB RESULTS:  None.   HISTORY AND PHYSICAL:  See the dictated version.   HOSPITAL COURSE:  1. Acute coronary syndrome.  The patient presented to the ED after      experiencing acute onset of severe chest pain described as pressure      radiating to the left arm on exertion.  This was accompanied by      shortness of breath and diaphoresis.  In the ED, the patient's EKG was      essentially normal, but CK-MB was rapidly trending  upward.  The patient      was asymptomatic in the ED and cycling cardiac markers revealed an      upward trend in troponin I as well and cardiology was consulted.  The      new EKG was very worrisome.  See procedures for specifics.  The patient      was catheterized.  Extensive coronary artery disease was found and a      stent was placed.  The patient was placed on Lipitor, aspirin,      Lopressor, and Plavix, and given nitroglycerin in case of chest pain.      Patient was advised to change his lifestyle and follow up closely with      cardiology and family practice primary care [name].  2. Hypertension.  The patient reported increased blood pressure readings      before presenting to the ED and still had pressures up to 172/119 in      the ED.  After being placed on metoprolol 25 mg b.i.d., the pressures      were well controlled.  3. Hypercholesterolemia.  The patient is adopted and does not know his      family history, but likely has a propensity for atherosclerosis.  Newly      discovered hypercholesterolemia is initially treated with Lipitor 40 mg      per day and will require outpatient follow up.  4. Tobacco cessation.  The patient has many risk factors for CAD and a      previous MI and is advised to stop smoking.  He will require follow up      with his PCP for this and has stated that he wishes to quit smoking.     ______________________________  [name]    ______________________________  [name], M.D.    Dorene Grebe  D:  02/12/2006  T:  02/12/2006  Job:  [id number]   cc:   [name], M.D.  [facility]

## 2010-09-20 NOTE — Consult Note (Signed)
NAMEBARAK, [name] NO.:  [id number]   MEDICAL RECORD NO.:  [id number]          PATIENT TYPE:  INP   LOCATION:  6527                         FACILITY:  [facility]   PHYSICIAN:  Armanda Magic, M.D.     DATE OF BIRTH:  06/09/1970   DATE OF CONSULTATION:  02/10/2006  DATE OF DISCHARGE:  02/12/2006                                   CONSULTATION   CHIEF COMPLAINT:  Chest pain.   HISTORY OF PRESENT ILLNESS:  This is a 40 year old white obese male with no  prior cardiac history.  He was admitted with substernal chest pain.  He had  been his usual state of health today lifting heavy material onto shelves.  He began walking and had substernal chest pain with radiation to the left  arm associated shortness of breath, diaphoresis and nausea.  The pain lasted  about 45 minutes.  He went to the pharmacy at [facility] when he went to work  and his blood pressure was 170/105.  He took an aspirin.  EMS was called.  He received nitroglycerin and morphine with resolution chest pain.  He is  now pain free.  There is no history of chest pain the past.   PAST MEDICAL HISTORY:  Includes rib fracture last week.   PAST SURGICAL HISTORY:  None.   MEDICATIONS:  None.   ALLERGIES:  None.   SOCIAL HISTORY:  He is married with two children.  He works at [employer].  He  does smoke tobacco.  He denies any alcohol or drug use.   FAMILY HISTORY:  He is adopted.   REVIEW OF SYSTEMS:  Otherwise is negative.   PHYSICAL EXAM:  Blood pressure is 172/119, heart rate 60-82, respirations  20, O2 saturations 100% room air.  This well-developed obese white male in  no distress.  HEENT:  Benign.  NECK:  Is supple without lymphadenopathy, carotid upstroke +2 bilaterally no  bruits.  LUNGS:  Clear to auscultation throughout.  HEART:  Regular rate and rhythm.  No murmurs, rubs or gallops, normal S1 S2.  ABDOMEN:  Obese, nontender, nondistended. Active bowel sounds.  No  hepatosplenomegaly.  EXTREMITIES: No cyanosis, erythema or edema.  +2 dorsalis pedis pulses and  radial pulses bilaterally.   LABS:  Sodium 137, potassium 4.3, chloride 105, BUN 8, creatinine 1, glucose  100, hematocrit 50, hemoglobin 17, troponin less than 0.05 x3.  CPK-MB 1.1,  2.4, 4.8, myoglobin 57, 127, 224.  D-dimer less than or equal to 0.22.  Chest x-ray is pending.  EKG shows sinus rhythm at 75 beats per minute with  no ST-T wave abnormalities.   ASSESSMENT:  1. Chest pain syndrome with negative enzymes x3 and nonischemic EKG,      currently chest painfree.  Cardiac risk factors include obesity.  2. Hypertension with elevated blood pressure and hypertensive urgency.  3. Obesity.   PLAN:  Agree with rule out myocardial infarction, needs aggressive blood  pressure management if enzymes are negative, stress Cardiolite the morning,  change to Lovenox per pharmacy if chest x-ray okay with no evidence of wide  mediastinum.  Armanda Magic, M.D.  Electronically Signed     TT/MEDQ  D:  02/17/2006  T:  02/18/2006  Job:  [id number]   cc:   William A. Leveda Anna, M.D.
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
## 2011-01-29 LAB — COMPREHENSIVE METABOLIC PANEL
AST: 24
BUN: 12
CO2: 26
Chloride: 102
Creatinine, Ser: 0.94
GFR calc non Af Amer: >60
Glucose, Bld: 113 — ABNORMAL HIGH
Total Bilirubin: 1.2

## 2011-01-29 LAB — BASIC METABOLIC PANEL
BUN: 13
CO2: 29
Calcium: 8.9
GFR calc non Af Amer: >60
GFR calc non Af Amer: >60
Glucose, Bld: 138 — ABNORMAL HIGH
Potassium: 4.2
Sodium: 140
Sodium: 139

## 2011-01-29 LAB — CARDIAC PANEL(CRET KIN+CKTOT+MB+TROPI): CK, MB: 0.6

## 2011-01-29 LAB — PROTIME-INR: Prothrombin Time: 12.4

## 2011-01-29 LAB — CBC
HCT: 41.4
HCT: 37.7 — ABNORMAL LOW
Hemoglobin: 13.7
Hemoglobin: 13.7
Hemoglobin: 14.5
Hemoglobin: 13.1
Hemoglobin: 13.2
MCHC: 34.9
MCHC: 34.9
MCHC: 35
MCHC: 35.5
MCV: 90.6
MCV: 90.9
Platelets: 208
Platelets: 173
RBC: 4.3
RBC: 4.35
RBC: 4.14 — ABNORMAL LOW
RDW: 12.8
RDW: 12.5
RDW: 13.1

## 2011-01-29 LAB — DIFFERENTIAL
Basophils Absolute: 0
Basophils Relative: 0
Monocytes Absolute: 0.3
Neutro Abs: 3.7

## 2011-01-29 LAB — APTT: aPTT: 28

## 2011-01-29 LAB — CK TOTAL AND CKMB (NOT AT ARMC)

## 2011-02-17 LAB — COMPREHENSIVE METABOLIC PANEL
ALT: 41
AST: 34
Albumin: 3.9
Alkaline Phosphatase: 62
BUN: 12
CO2: 29
Calcium: 9.7
Chloride: 102
Creatinine, Ser: 0.96
GFR calc Af Amer: >60
GFR calc non Af Amer: >60
Glucose, Bld: 89
Potassium: 4.4
Sodium: 138
Total Bilirubin: 0.9
Total Protein: 6.8

## 2011-02-17 LAB — CBC
HCT: 36.3 — ABNORMAL LOW
HCT: 36.9 — ABNORMAL LOW
Hemoglobin: 12.7 — ABNORMAL LOW
Hemoglobin: 13.1
MCHC: 34.7
MCHC: 35
MCHC: 35.4
MCV: 86.5
MCV: 87.3
Platelets: 194
Platelets: 206
RBC: 4.16 — ABNORMAL LOW
RBC: 4.26
RDW: 13
RDW: 13.1
RDW: 13.3
WBC: 5.2
WBC: 5.3

## 2011-02-17 LAB — MAGNESIUM: Magnesium: 2.1

## 2011-02-17 LAB — LIPID PANEL
Cholesterol: 129
HDL: 30 — ABNORMAL LOW
LDL Cholesterol: 50
Total CHOL/HDL Ratio: 4.3
Triglycerides: 244 — ABNORMAL HIGH
VLDL: 49 — ABNORMAL HIGH

## 2011-02-17 LAB — DIFFERENTIAL
Eosinophils Relative: 3
Lymphocytes Relative: 27
Lymphs Abs: 1.4
Monocytes Absolute: 0.4
Monocytes Relative: 8

## 2011-02-17 LAB — CARDIAC PANEL(CRET KIN+CKTOT+MB+TROPI)
CK, MB: 0.7
Relative Index: INVALID
Relative Index: INVALID
Relative Index: INVALID
Total CK: 46
Total CK: 58
Troponin I: 0.01
Troponin I: 0.01
Troponin I: 0.01

## 2011-02-17 LAB — BASIC METABOLIC PANEL
CO2: 28
Calcium: 9.2
Creatinine, Ser: 0.82
GFR calc Af Amer: >60
GFR calc non Af Amer: >60
Glucose, Bld: 135 — ABNORMAL HIGH

## 2011-02-17 LAB — APTT: aPTT: 33

## 2011-02-18 LAB — BASIC METABOLIC PANEL
Calcium: 9.3
Chloride: 102
Creatinine, Ser: 0.84
GFR calc Af Amer: >60

## 2011-02-18 LAB — CBC
RBC: 4.01 — ABNORMAL LOW
WBC: 6.7

## 2011-02-19 LAB — LIPID PANEL
Cholesterol: 131
HDL: 35 — ABNORMAL LOW
Triglycerides: 164 — ABNORMAL HIGH

## 2011-02-19 LAB — CBC
Hemoglobin: 11.6 — ABNORMAL LOW
MCHC: 34.7
RDW: 12.7

## 2011-02-19 LAB — BASIC METABOLIC PANEL
CO2: 28
Calcium: 8.7
Creatinine, Ser: 0.82
Glucose, Bld: 120 — ABNORMAL HIGH

## 2011-02-19 LAB — HEMOGLOBIN A1C: Hgb A1c MFr Bld: 5.8

## 2015-12-10 ENCOUNTER — Emergency Department
Admission: EM | Admit: 2015-12-10 | Discharge: 2015-12-10 | Disposition: A | Discharge status: Home / Self Care | Payer: Worker's Compensation | Attending: Emergency Medicine | Admitting: Emergency Medicine

## 2015-12-10 ENCOUNTER — Emergency Department: Payer: Worker's Compensation

## 2015-12-10 ENCOUNTER — Encounter: Payer: Self-pay | Admitting: Emergency Medicine

## 2015-12-10 DIAGNOSIS — F17200 Nicotine dependence, unspecified, uncomplicated: Secondary | ICD-10-CM | POA: Insufficient documentation

## 2015-12-10 DIAGNOSIS — I252 Old myocardial infarction: Secondary | ICD-10-CM | POA: Diagnosis not present

## 2015-12-10 DIAGNOSIS — Z7982 Long term (current) use of aspirin: Secondary | ICD-10-CM | POA: Diagnosis not present

## 2015-12-10 DIAGNOSIS — Z7951 Long term (current) use of inhaled steroids: Secondary | ICD-10-CM | POA: Diagnosis not present

## 2015-12-10 DIAGNOSIS — S82291A Other fracture of shaft of right tibia, initial encounter for closed fracture: Secondary | ICD-10-CM

## 2015-12-10 DIAGNOSIS — S82224A Nondisplaced transverse fracture of shaft of right tibia, initial encounter for closed fracture: Secondary | ICD-10-CM | POA: Diagnosis not present

## 2015-12-10 DIAGNOSIS — Y990 Civilian activity done for income or pay: Secondary | ICD-10-CM | POA: Diagnosis not present

## 2015-12-10 DIAGNOSIS — Y939 Activity, unspecified: Secondary | ICD-10-CM | POA: Diagnosis not present

## 2015-12-10 DIAGNOSIS — W0110XA Fall on same level from slipping, tripping and stumbling with subsequent striking against unspecified object, initial encounter: Secondary | ICD-10-CM | POA: Insufficient documentation

## 2015-12-10 DIAGNOSIS — Y929 Unspecified place or not applicable: Secondary | ICD-10-CM | POA: Insufficient documentation

## 2015-12-10 DIAGNOSIS — I10 Essential (primary) hypertension: Secondary | ICD-10-CM | POA: Diagnosis not present

## 2015-12-10 DIAGNOSIS — M25561 Pain in right knee: Secondary | ICD-10-CM | POA: Diagnosis present

## 2015-12-10 HISTORY — DX: Pure hypercholesterolemia, unspecified: E78.00

## 2015-12-10 HISTORY — DX: Acute myocardial infarction, unspecified: I21.9

## 2015-12-10 HISTORY — DX: Gastro-esophageal reflux disease without esophagitis: K21.9

## 2015-12-10 HISTORY — DX: Essential (primary) hypertension: I10

## 2015-12-10 MED ORDER — OXYCODONE-ACETAMINOPHEN 5-325 MG PO TABS
1.0000 | ORAL_TABLET | Freq: Once | ORAL | Status: AC
  Administered 2015-12-10: 1 via ORAL
  Filled 2015-12-10: qty 1

## 2015-12-10 MED ORDER — OXYCODONE-ACETAMINOPHEN 7.5-325 MG PO TABS: 1.0000 | ORAL_TABLET | ORAL | 0 refills | Status: DC | PRN

## 2015-12-10 MED ORDER — BACITRACIN ZINC 500 UNIT/GM EX OINT: TOPICAL_OINTMENT | Freq: Once | CUTANEOUS | Status: DC

## 2015-12-10 MED ORDER — NAPROXEN 500 MG PO TABS
500.0000 mg | ORAL_TABLET | Freq: Once | ORAL | Status: AC
  Administered 2015-12-10: 500 mg via ORAL
  Filled 2015-12-10: qty 1

## 2015-12-10 NOTE — ED Provider Notes (Signed)
[facility] Emergency Department Provider Note   ____________________________________________   First MD Initiated Contact with Patient 12/10/15 1123     (approximate)  I have reviewed the triage vital signs and the nursing notes.   HISTORY  Chief Complaint Knee Pain    HPI [name] is a 45 y.o. male patient complaining of right knee pain secondary to a slip and fallapproximately 2 hours ago at work. Patient state he was carrying some ice and evidently some of her mouth felt on the floor and as he turned around he slipped and fell on his right knee. Patient is rating his pain as a 4/10. Patient described a pain as "achy". Patient states pain increases with ambulation. Patient applied ice to the area prior to arrival.  Past Medical History:  Diagnosis Date  . GERD (gastroesophageal reflux disease)   . High cholesterol   . Hypertension   . Myocardial infarct [facility])     Patient Active Problem List   Diagnosis Date Noted  . HYPERLIPIDEMIA 12/08/2007  . HYPERTENSION 12/08/2007  . MYOCARDIAL INFARCTION, HX OF 12/08/2007  . CORONARY ARTERY DISEASE 12/08/2007  . VENOUS INSUFFICIENCY, CHRONIC 10/06/2007  . OBESITY NOS 01/19/2007  . GASTROESOPHAGEAL REFLUX DISEASE 10/16/2006    History reviewed. No pertinent surgical history.  Prior to Admission medications   Medication Sig Start Date End Date Taking? Authorizing Provider  albuterol (VENTOLIN HFA) 108 (90 BASE) MCG/ACT inhaler 1-2 inhaled q4 hrs as needed  dispense 8 gram inhale     Historical Provider, MD  amLODipine (NORVASC) 5 MG tablet Take 5 mg by mouth daily. For blood pressure     Historical Provider, MD  aspirin 325 MG tablet Take 325 mg by mouth daily.      Historical Provider, MD  Choline Fenofibrate (TRILIPIX) 135 MG capsule Take 135 mg by mouth daily.      Historical Provider, MD  clopidogrel (PLAVIX) 75 MG tablet Take 75 mg by mouth daily. For stents     Historical Provider, MD    ezetimibe-simvastatin (VYTORIN) 10-40 MG per tablet Take 1 tablet by mouth at bedtime. For cholesterol     Historical Provider, MD  famotidine (PEPCID) 20 MG tablet Take 20 mg by mouth daily.      Historical Provider, MD  furosemide (LASIX) 40 MG tablet Take 1 tablet in the morning by mouth and 0.5 tablet in the evening by mouth     Historical Provider, MD  isosorbide mononitrate (IMDUR) 120 MG 24 hr tablet Per cardiology Dr Radford Pax     Historical Provider, MD  lisinopril (PRINIVIL,ZESTRIL) 10 MG tablet Take 10 mg by mouth daily.      Historical Provider, MD  metoprolol (LOPRESSOR) 50 MG tablet Take 50 mg by mouth 2 (two) times daily. For blood pressure     Historical Provider, MD  pantoprazole (PROTONIX) 40 MG tablet Take 40 mg by mouth daily. For reflux     Historical Provider, MD  ranolazine (RANEXA) 1000 MG SR tablet Take 500 mg by mouth 2 (two) times daily.      Historical Provider, MD    Allergies Review of patient's allergies indicates no known allergies.  History reviewed. No pertinent family history.  Social History Social History  Substance Use Topics  . Smoking status: Current Every Day Smoker  . Smokeless tobacco: Never Used  . Alcohol use No    Review of Systems Constitutional: No fever/chills Eyes: No visual changes. ENT: No sore throat. Cardiovascular: Denies chest  pain. Respiratory: Denies shortness of breath. Gastrointestinal: No abdominal pain.  No nausea, no vomiting.  No diarrhea.  No constipation. Genitourinary: Negative for dysuria. Musculoskeletal: Right anterior knee pain Skin: Negative for rash. Neurological: Negative for headaches, focal weakness or numbness. Endocrine:Hyperlipidemia and hypertension.  ____________________________________________   PHYSICAL EXAM:  VITAL SIGNS: ED Triage Vitals  Enc Vitals Group     BP 12/10/15 1120 (!) 149/90     Pulse Rate 12/10/15 1120 88     Resp 12/10/15 1120 18     Temp 12/10/15 1120 98.3 F (36.8 C)      Temp Source 12/10/15 1120 Oral     SpO2 12/10/15 1120 96 %     Weight 12/10/15 1120 220 lb (99.8 kg)     Height 12/10/15 1120 5' 10"  (1.778 m)     Head Circumference --      Peak Flow --      Pain Score 12/10/15 1110 4     Pain Loc --      Pain Edu? --      Excl. in [place]? --     Constitutional: Alert and oriented. Well appearing and in no acute distress. Eyes: Conjunctivae are normal. PERRL. EOMI. Head: Atraumatic. Nose: No congestion/rhinnorhea. Mouth/Throat: Mucous membranes are moist.  Oropharynx non-erythematous. Neck: No stridor.  No cervical spine tenderness to palpation. Hematological/Lymphatic/Immunilogical: No cervical lymphadenopathy. Cardiovascular: Normal rate, regular rhythm. Grossly normal heart sounds.  Good peripheral circulation. Respiratory: Normal respiratory effort.  No retractions. Lungs CTAB. Gastrointestinal: Soft and nontender. No distention. No abdominal bruits. No CVA tenderness. Musculoskeletal: No obvious deformity to the right knee. There is no abrasion or edema to the anterior patella. Marked crepitus palpation. Patient is able to bear weight.  Neurologic:  Normal speech and language. No gross focal neurologic deficits are appreciated. No gait instability. Skin:  Skin is warm, dry and intact. No rash noted. Psychiatric: Mood and affect are normal. Speech and behavior are normal.  ____________________________________________   LABS (all labs ordered are listed, but only abnormal results are displayed)  Labs Reviewed - No data to display ____________________________________________  EKG   ____________________________________________  RADIOLOGY  X-ray reveal tibial tuberosity fracture of the right leg. ____________________________________________   PROCEDURES  Procedure(s) performed: None  Procedures  Critical Care performed: No  ____________________________________________   INITIAL IMPRESSION / ASSESSMENT AND PLAN / ED  COURSE  Pertinent labs & imaging results that were available during my care of the patient were reviewed by me and considered in my medical decision making (see chart for details).  Right Tibia fracture. Sclerae x-ray finding with patient. Patient given discharge care instructions. Patient placed in a knee immobilizer and given crutches for ambulation. Patient advised to follow orthopedics by calling for an appointment tomorrow. Patient given a prescription for Percocets.  Clinical Course     ____________________________________________   FINAL CLINICAL IMPRESSION(S) / ED DIAGNOSES  Final diagnoses:  Closed tibia fracture, right, initial encounter      NEW MEDICATIONS STARTED DURING THIS VISIT:  New Prescriptions   No medications on file     Note:  This document was prepared using Dragon voice recognition software and may include unintentional dictation errors.    Sable Feil, PA-C 12/10/15 1256    Lavonia Drafts, MD 12/10/15 [phone]

## 2015-12-10 NOTE — ED Notes (Signed)
See triage note  States he fell landed on  Right knee     Positive swelling  No deformity

## 2015-12-10 NOTE — Discharge Instructions (Signed)
Ambulate with crutches and wear splint until evaluation by or the doctor.

## 2015-12-10 NOTE — ED Triage Notes (Signed)
Pt to ed with c/o right knee pain after slipping on water on the floor at work. Right knee with redness and swelling.

## 2016-07-05 ENCOUNTER — Ambulatory Visit
Admission: EM | Admit: 2016-07-05 | Discharge: 2016-07-05 | Disposition: A | Discharge status: Home / Self Care | Payer: Commercial Managed Care - HMO | Attending: Family Medicine | Admitting: Family Medicine

## 2016-07-05 ENCOUNTER — Encounter: Payer: Self-pay | Admitting: Gynecology

## 2016-07-05 DIAGNOSIS — I10 Essential (primary) hypertension: Secondary | ICD-10-CM

## 2016-07-05 DIAGNOSIS — E785 Hyperlipidemia, unspecified: Secondary | ICD-10-CM

## 2016-07-05 DIAGNOSIS — I2510 Atherosclerotic heart disease of native coronary artery without angina pectoris: Secondary | ICD-10-CM

## 2016-07-05 DIAGNOSIS — K219 Gastro-esophageal reflux disease without esophagitis: Secondary | ICD-10-CM | POA: Diagnosis not present

## 2016-07-05 DIAGNOSIS — M79601 Pain in right arm: Secondary | ICD-10-CM | POA: Diagnosis not present

## 2016-07-05 MED ORDER — DICLOFENAC SODIUM 75 MG PO TBEC: 75.0000 mg | DELAYED_RELEASE_TABLET | Freq: Two times a day (BID) | ORAL | 0 refills | Status: AC | PRN

## 2016-07-05 MED ORDER — KETOROLAC TROMETHAMINE 60 MG/2ML IM SOLN
30.0000 mg | Freq: Once | INTRAMUSCULAR | Status: AC
  Administered 2016-07-05: 30 mg via INTRAMUSCULAR

## 2016-07-05 MED ORDER — LISINOPRIL 10 MG PO TABS: 10.0000 mg | ORAL_TABLET | Freq: Every day | ORAL | 0 refills | Status: AC

## 2016-07-05 MED ORDER — CLOPIDOGREL BISULFATE 75 MG PO TABS: 75.0000 mg | ORAL_TABLET | Freq: Every day | ORAL | 0 refills | Status: AC

## 2016-07-05 MED ORDER — METOPROLOL SUCCINATE ER 50 MG PO TB24: ORAL_TABLET | ORAL | 0 refills | Status: AC

## 2016-07-05 MED ORDER — ATORVASTATIN CALCIUM 20 MG PO TABS: 20.0000 mg | ORAL_TABLET | Freq: Every day | ORAL | 0 refills | Status: AC

## 2016-07-05 MED ORDER — PANTOPRAZOLE SODIUM 20 MG PO TBEC: 20.0000 mg | DELAYED_RELEASE_TABLET | Freq: Every day | ORAL | 0 refills | Status: AC

## 2016-07-05 MED ORDER — FUROSEMIDE 20 MG PO TABS: 20.0000 mg | ORAL_TABLET | Freq: Every day | ORAL | 0 refills | Status: AC

## 2016-07-05 NOTE — ED Provider Notes (Signed)
CSN: [id number]     Arrival date & time 07/05/16  1536 History   First MD Initiated Contact with Patient 07/05/16 1559     Chief Complaint  Patient presents with  . Arm Pain   (Consider location/radiation/quality/duration/timing/severity/associated sxs/prior Treatment) 46 year old male presents with right lower arm pain and tingling for the past 3 days. Pain is worse at night. Denies any fever, neck pain, chest pain, difficulty breathing or shoulder pain. Took Tramadol with no relief. Has history of CAD (4 MI's in which he experienced left arm pain but the pain in his right arm is different), HTN, hyperlipidemia and GERD. He is on multiple medications and has no PCP. His Cardiologist who has been prescribing these medications is in [facility] and he recently moved to this area. He requests 30 day supply of all his medications while he becomes established with a local PCP and Cardiologist. He also continues to smoke a pack a day and works at [employer] and does multiple lifting with his arms. He denies any distinct trauma or injury to his right arm or elbow.    The history is provided by the patient.    Past Medical History:  Diagnosis Date  . GERD (gastroesophageal reflux disease)   . High cholesterol   . Hypertension   . Myocardial infarct    Past Surgical History:  Procedure Laterality Date  . STENT PLACEMENT ILIAC ([place] HX)     No family history on file. Social History  Substance Use Topics  . Smoking status: Current Every Day Smoker    Packs/day: 1.00    Types: Cigarettes  . Smokeless tobacco: Never Used  . Alcohol use No    Review of Systems  Constitutional: Negative for activity change, fatigue, fever and unexpected weight change.  HENT: Negative for congestion.   Respiratory: Negative for cough, chest tightness, shortness of breath and wheezing.   Cardiovascular: Negative for chest pain and leg swelling.  Gastrointestinal: Negative for diarrhea, nausea and vomiting.   Musculoskeletal: Positive for myalgias. Negative for back pain, joint swelling, neck pain and neck stiffness.  Skin: Negative for rash and wound.  Neurological: Positive for numbness. Negative for dizziness, syncope, weakness, light-headedness and headaches.  Hematological: Negative for adenopathy.    Allergies  Patient has no known allergies.  Home Medications   Prior to Admission medications   Medication Sig Start Date End Date Taking? Authorizing Provider  aspirin 325 MG tablet Take 325 mg by mouth daily.     Yes Historical Provider, MD  atorvastatin (LIPITOR) 20 MG tablet Take 1 tablet (20 mg total) by mouth daily. 07/05/16 08/04/16  Katy Apo, NP  clopidogrel (PLAVIX) 75 MG tablet Take 1 tablet (75 mg total) by mouth daily. 07/05/16 08/04/16  Katy Apo, NP  diclofenac (VOLTAREN) 75 MG EC tablet Take 1 tablet (75 mg total) by mouth 2 (two) times daily as needed for moderate pain. 07/05/16   Katy Apo, NP  furosemide (LASIX) 20 MG tablet Take 1 tablet (20 mg total) by mouth daily. 07/05/16   Katy Apo, NP  lisinopril (PRINIVIL,ZESTRIL) 10 MG tablet Take 1 tablet (10 mg total) by mouth daily. 07/05/16 08/04/16  Katy Apo, NP  metoprolol succinate (TOPROL-XL) 50 MG 24 hr tablet Take 1/2 tablet twice a day 07/05/16   Katy Apo, NP  pantoprazole (PROTONIX) 20 MG tablet Take 1 tablet (20 mg total) by mouth daily. 07/05/16 08/04/16  Katy Apo, NP  Meds Ordered and Administered this Visit   Medications  ketorolac (TORADOL) injection 30 mg (30 mg Intramuscular Given 07/05/16 1626)    BP 135/73 (BP Location: Left Arm)   Pulse 83   Temp 98.1 F (36.7 C) (Oral)   Ht 5' 10"  (1.778 m)   Wt 240 lb (108.9 kg)   SpO2 97%   BMI 34.44 kg/m  No data found.   Physical Exam  Constitutional: He is oriented to person, place, and time. He appears well-developed and well-nourished. No distress.  Neck: Trachea normal, normal range of motion and full passive range of motion  without pain. Neck supple.  Cardiovascular: Normal rate, regular rhythm and normal heart sounds.   Pulmonary/Chest: Effort normal and breath sounds normal. No respiratory distress. He has no decreased breath sounds. He has no wheezes. He has no rhonchi. He has no rales.  Musculoskeletal: Normal range of motion. He exhibits tenderness. He exhibits no edema.       Right elbow: He exhibits normal range of motion, no swelling and no effusion. Tenderness found. Lateral epicondyle tenderness noted.       Left forearm: He exhibits tenderness. He exhibits no swelling, no edema and no deformity.  Has full range of motion of elbow and wrist. Tender along ulnar nerve pathway from elbow to wrist. No distinct numbness or neuro deficits noted. Good strength. Normal distal pulses.    Lymphadenopathy:    He has no cervical adenopathy.  Neurological: He is alert and oriented to person, place, and time. He has normal strength and normal reflexes. No sensory deficit.  Skin: Skin is warm and dry. Capillary refill takes less than 2 seconds. No rash noted. No erythema.  Psychiatric: He has a normal mood and affect. His behavior is normal. Judgment and thought content normal.    Urgent Care Course     Procedures (including critical care time)  Labs Review Labs Reviewed - No data to display  Imaging Review No results found.   Visual Acuity Review  Right Eye Distance:   Left Eye Distance:   Bilateral Distance:    Right Eye Near:   Left Eye Near:    Bilateral Near:         MDM   1. Right arm pain   2. Essential hypertension   3. Hyperlipidemia, unspecified hyperlipidemia type   4. Coronary artery disease involving native heart without angina pectoris, unspecified vessel or lesion type   5. Gastroesophageal reflux disease, esophagitis presence not specified    Discussed with patient that arm pain is probably due to inflammation/irritation of ulnar nerve. Gave Toradol 35m IM today since  uncertain of kidney function status. Provided new Rx for Metoprolol, Lisinopril, Lasix, Atorvastatin, Plavix and Pantoprazole for 30 days. Discussed risk of taking anti-inflammatories and history of CAD and HTN. Patient wants to trial Voltaren [id number] a day as needed for pain. Information provided regarding local Cardiologist as well as primary care providers. Recommend follow-up with a PCP in 4 to 5 days if current symptoms are not improving or go to ER. Recommend patient become established with a local Cardiologist within 30 days for continued management of chronic cardiovascular conditions.     AnKaty ApoNP 07/05/16 [phone]

## 2016-07-05 NOTE — ED Triage Notes (Signed)
Per patient pain in right arm x 3 days ago.

## 2016-07-05 NOTE — Discharge Instructions (Signed)
You were given a shot of Toradol 102m today to help with pain and inflammation. May also take Voltaren 745mtwice a day as needed. Recommend follow-up with a primary care provider within 4 to 5 days if not improving as well as with a Cardiologist for recheck and maintenance of hypertension and heart disease.

## 2016-10-23 ENCOUNTER — Emergency Department: Payer: 59

## 2016-10-23 ENCOUNTER — Ambulatory Visit (INDEPENDENT_AMBULATORY_CARE_PROVIDER_SITE_OTHER)
Admission: EM | Admit: 2016-10-23 | Discharge: 2016-10-23 | Disposition: A | Discharge status: Other Acute Inpatient Hospital | Payer: 59 | Source: Home / Self Care | Attending: Family Medicine | Admitting: Family Medicine

## 2016-10-23 ENCOUNTER — Encounter: Payer: Self-pay | Admitting: Emergency Medicine

## 2016-10-23 ENCOUNTER — Encounter: Payer: Self-pay | Admitting: *Deleted

## 2016-10-23 ENCOUNTER — Emergency Department
Admission: EM | Admit: 2016-10-23 | Discharge: 2016-10-23 | Disposition: A | Discharge status: Home / Self Care | Payer: 59 | Attending: Emergency Medicine | Admitting: Emergency Medicine

## 2016-10-23 DIAGNOSIS — Z7984 Long term (current) use of oral hypoglycemic drugs: Secondary | ICD-10-CM | POA: Insufficient documentation

## 2016-10-23 DIAGNOSIS — E119 Type 2 diabetes mellitus without complications: Secondary | ICD-10-CM

## 2016-10-23 DIAGNOSIS — I25110 Atherosclerotic heart disease of native coronary artery with unstable angina pectoris: Secondary | ICD-10-CM | POA: Insufficient documentation

## 2016-10-23 DIAGNOSIS — Z9889 Other specified postprocedural states: Secondary | ICD-10-CM

## 2016-10-23 DIAGNOSIS — I200 Unstable angina: Secondary | ICD-10-CM | POA: Diagnosis not present

## 2016-10-23 DIAGNOSIS — E7800 Pure hypercholesterolemia, unspecified: Secondary | ICD-10-CM

## 2016-10-23 DIAGNOSIS — R0789 Other chest pain: Secondary | ICD-10-CM | POA: Diagnosis not present

## 2016-10-23 DIAGNOSIS — Z8249 Family history of ischemic heart disease and other diseases of the circulatory system: Secondary | ICD-10-CM | POA: Insufficient documentation

## 2016-10-23 DIAGNOSIS — R079 Chest pain, unspecified: Secondary | ICD-10-CM | POA: Diagnosis not present

## 2016-10-23 DIAGNOSIS — I252 Old myocardial infarction: Secondary | ICD-10-CM | POA: Diagnosis not present

## 2016-10-23 DIAGNOSIS — I10 Essential (primary) hypertension: Secondary | ICD-10-CM | POA: Insufficient documentation

## 2016-10-23 DIAGNOSIS — Z79899 Other long term (current) drug therapy: Secondary | ICD-10-CM | POA: Insufficient documentation

## 2016-10-23 DIAGNOSIS — E669 Obesity, unspecified: Secondary | ICD-10-CM | POA: Insufficient documentation

## 2016-10-23 DIAGNOSIS — I872 Venous insufficiency (chronic) (peripheral): Secondary | ICD-10-CM

## 2016-10-23 DIAGNOSIS — F17210 Nicotine dependence, cigarettes, uncomplicated: Secondary | ICD-10-CM

## 2016-10-23 DIAGNOSIS — Z7982 Long term (current) use of aspirin: Secondary | ICD-10-CM | POA: Insufficient documentation

## 2016-10-23 DIAGNOSIS — Z7902 Long term (current) use of antithrombotics/antiplatelets: Secondary | ICD-10-CM | POA: Insufficient documentation

## 2016-10-23 DIAGNOSIS — I2510 Atherosclerotic heart disease of native coronary artery without angina pectoris: Secondary | ICD-10-CM | POA: Diagnosis not present

## 2016-10-23 DIAGNOSIS — Z95820 Peripheral vascular angioplasty status with implants and grafts: Secondary | ICD-10-CM | POA: Diagnosis not present

## 2016-10-23 HISTORY — DX: Type 2 diabetes mellitus without complications: E11.9

## 2016-10-23 LAB — CBC
HCT: 43.9 % (ref 40.0–52.0)
HEMOGLOBIN: 15.1 g/dL (ref 13.0–18.0)
MCH: 31.3 pg (ref 26.0–34.0)
MCHC: 34.5 g/dL (ref 32.0–36.0)
MCV: 90.9 fL (ref 80.0–100.0)
Platelets: 174 10*3/uL (ref 150–440)
RBC: 4.84 MIL/uL (ref 4.40–5.90)
RDW: 13.6 % (ref 11.5–14.5)
WBC: 5.5 10*3/uL (ref 3.8–10.6)

## 2016-10-23 LAB — BASIC METABOLIC PANEL
ANION GAP: 5 (ref 5–15)
BUN: 12 mg/dL (ref 6–20)
CALCIUM: 8.3 mg/dL — AB (ref 8.9–10.3)
CO2: 24 mmol/L (ref 22–32)
Chloride: 110 mmol/L (ref 101–111)
Creatinine, Ser: 0.72 mg/dL (ref 0.61–1.24)
GFR calc Af Amer: >60 mL/min (ref >60)
GLUCOSE: 85 mg/dL (ref 65–99)
POTASSIUM: 3.3 mmol/L — AB (ref 3.5–5.1)
SODIUM: 139 mmol/L (ref 135–145)

## 2016-10-23 LAB — TROPONIN I

## 2016-10-23 MED ORDER — ASPIRIN 81 MG PO CHEW
324.0000 mg | CHEWABLE_TABLET | Freq: Once | ORAL | Status: AC
  Administered 2016-10-23: 324 mg via ORAL

## 2016-10-23 MED ORDER — NITROGLYCERIN 0.4 MG SL SUBL
0.4000 mg | SUBLINGUAL_TABLET | SUBLINGUAL | Status: DC | PRN
  Administered 2016-10-23: 0.4 mg via SUBLINGUAL

## 2016-10-23 MED ORDER — MORPHINE SULFATE (PF) 2 MG/ML IV SOLN
2.0000 mg | Freq: Once | INTRAVENOUS | Status: AC
  Administered 2016-10-23: 2 mg via INTRAVENOUS
  Filled 2016-10-23: qty 1

## 2016-10-23 MED ORDER — NITROGLYCERIN 0.4 MG SL SUBL: 0.4000 mg | SUBLINGUAL_TABLET | SUBLINGUAL | 3 refills | Status: AC | PRN

## 2016-10-23 MED ORDER — ASPIRIN 81 MG PO CHEW: 324.0000 mg | CHEWABLE_TABLET | Freq: Once | ORAL | Status: DC

## 2016-10-23 NOTE — ED Provider Notes (Signed)
[facility]    CSN: [id number] Arrival date & time: 10/23/16  1711     History   Chief Complaint Chief Complaint  Patient presents with  . Chest Pain    HPI [name] is a 46 y.o. male.   Patient is a 46 year old white male who presents with chest pain. States them chest pains, have to hours. He went outside to listen. He reports he haS an appointment with cardiologist next week. He and his wife moved back into the area so he does not have a cardiologist established. Apparently about 5 years ago he had a heart attack he has several stents placed between 4-6.unfortunately he still smokes states that he was smoking up to 3 packs a day now he is down to 1 pack a day. She is a history of diabetes GERD elevated cholesterol hypertension as well. He states he is taking statin age is they have a statin increased. His birth father died of a massive heart attack he thinks some time in his early 73s. No known drug allergies..    The history is provided by the patient. No language interpreter was used.  Chest Pain  Pain location:  L lateral chest Pain quality: crushing and sharp   Pain quality: not radiating and not stabbing   Pain radiates to:  Does not radiate Pain severity:  Moderate Timing:  Constant Chronicity:  New Context: not raising an arm and not stress   Relieved by:  Nothing Worsened by:  Nothing Risk factors: coronary artery disease, high cholesterol, hypertension and male sex     Past Medical History:  Diagnosis Date  . Diabetes mellitus without complication ([place])   . GERD (gastroesophageal reflux disease)   . High cholesterol   . Hypertension   . Myocardial infarct [facility])     Patient Active Problem List   Diagnosis Date Noted  . HYPERLIPIDEMIA 12/08/2007  . HYPERTENSION 12/08/2007  . MYOCARDIAL INFARCTION, HX OF 12/08/2007  . CORONARY ARTERY DISEASE 12/08/2007  . VENOUS INSUFFICIENCY, CHRONIC 10/06/2007  . OBESITY NOS 01/19/2007  .  GASTROESOPHAGEAL REFLUX DISEASE 10/16/2006    Past Surgical History:  Procedure Laterality Date  . STENT PLACEMENT ILIAC ([place] HX)         Home Medications    Prior to Admission medications   Medication Sig Start Date End Date Taking? Authorizing Provider  aspirin 325 MG tablet Take 325 mg by mouth daily.     Yes [provider]  atorvastatin (LIPITOR) 20 MG tablet Take 1 tablet (20 mg total) by mouth daily. 07/05/16 10/23/16 Yes Amyot, Nicholes Stairs, NP  atorvastatin (LIPITOR) 40 MG tablet Take 40 mg by mouth daily.   Yes [provider]  furosemide (LASIX) 20 MG tablet Take 1 tablet (20 mg total) by mouth daily. 07/05/16  Yes Amyot, Nicholes Stairs, NP  lisinopril (PRINIVIL,ZESTRIL) 10 MG tablet Take 1 tablet (10 mg total) by mouth daily. 07/05/16 10/23/16 Yes Amyot, Nicholes Stairs, NP  metFORMIN (GLUCOPHAGE) 500 MG tablet Take 500 mg by mouth 2 (two) times daily with a meal.   Yes [provider]  metoprolol succinate (TOPROL-XL) 50 MG 24 hr tablet Take 1/2 tablet twice a day 07/05/16  Yes Amyot, Nicholes Stairs, NP  pantoprazole (PROTONIX) 20 MG tablet Take 1 tablet (20 mg total) by mouth daily. 07/05/16 10/23/16 Yes Amyot, Nicholes Stairs, NP  diclofenac (VOLTAREN) 75 MG EC tablet Take 1 tablet (75 mg total) by mouth 2 (two) times daily as needed for  moderate pain. 07/05/16   Katy Apo, NP    Family History History reviewed. No pertinent family history.  Social History Social History  Substance Use Topics  . Smoking status: Current Every Day Smoker    Packs/day: 1.00    Types: Cigarettes  . Smokeless tobacco: Never Used  . Alcohol use No     Allergies   Patient has no known allergies.   Review of Systems Review of Systems  Cardiovascular: Positive for chest pain.  All other systems reviewed and are negative.    Physical Exam Triage Vital Signs ED Triage Vitals  Enc Vitals Group     BP 10/23/16 1722 135/87     Pulse Rate 10/23/16 1722 75     Resp 10/23/16 1722 18       Temp 10/23/16 1722 98.1 F (36.7 C)     Temp Source 10/23/16 1722 Oral     SpO2 10/23/16 1722 98 %     Weight 10/23/16 1723 221 lb (100.2 kg)     Height 10/23/16 1723 5' 10"  (1.778 m)     Head Circumference --      Peak Flow --      Pain Score 10/23/16 1724 4     Pain Loc --      Pain Edu? --      Excl. in [place]? --    No data found.   Updated Vital Signs BP 116/66 (BP Location: Right Arm)   Pulse 81   Temp 98.1 F (36.7 C) (Oral)   Resp 16   Ht 5' 10"  (1.778 m)   Wt 221 lb (100.2 kg)   SpO2 95%   BMI 31.71 kg/m   Visual Acuity Right Eye Distance:   Left Eye Distance:   Bilateral Distance:    Right Eye Near:   Left Eye Near:    Bilateral Near:     Physical Exam  Constitutional: He is oriented to person, place, and time. He appears well-developed and well-nourished.  HENT:  Head: Normocephalic and atraumatic.  Right Ear: External ear normal.  Left Ear: External ear normal.  Eyes: Pupils are equal, round, and reactive to light.  Neck: Normal range of motion. Neck supple. No tracheal deviation present. No thyromegaly present.  Cardiovascular: Normal rate, regular rhythm and normal heart sounds.   Pulmonary/Chest: Effort normal and breath sounds normal.  Abdominal: Soft.  Musculoskeletal: Normal range of motion. He exhibits no edema or deformity.  Neurological: He is alert and oriented to person, place, and time. No cranial nerve deficit.  Skin: Skin is warm and dry.  Psychiatric: He has a normal mood and affect.  Vitals reviewed.    UC Treatments / Results  Labs (all labs ordered are listed, but only abnormal results are displayed) Labs Reviewed - No data to display  EKG  EKG Interpretation None      .ED ECG REPORT I, Reine Bristow H, the attending physician, personally viewed and interpreted this ECG.   Date: 10/23/2016  EKG Time:17:19:43  Rate: 69  Rhythm: there are no previous tracings available for comparison, Q waves in II,III,AVf , left  axis deviation  Axis: 75  Intervals:none  ST&T Change: none Radiology No results found.  Procedures Procedures (including critical care time)  Medications Ordered in UC Medications  nitroGLYCERIN (NITROSTAT) SL tablet 0.4 mg (0.4 mg Sublingual Given 10/23/16 1744)  aspirin chewable tablet 324 mg (324 mg Oral Given 10/23/16 1749)     Initial Impression / Assessment and Plan /  UC Course  I have reviewed the triage vital signs and the nursing notes.  Pertinent labs & imaging results that were available during my care of the patient were reviewed by me and considered in my medical decision making (see chart for details).    patient had a saline lock started and was given sublingual nitroglycerin after some saline lock was established and 4 baby aspirin. EMS was called and patient transported to [facility] ED. Charge nurse Marya Amsler was notified by myself of patient's pending arrival.    Final Clinical Impressions(s) / UC Diagnoses   Final diagnoses:  Chest pain, unspecified type  Unstable angina [facility])    New Prescriptions New Prescriptions   No medications on file     Note: This dictation was prepared with Dragon dictation along with smaller phrase technology. Any transcriptional errors that result from this process are unintentional.   Frederich Cha, MD 10/23/16 [phone]

## 2016-10-23 NOTE — ED Triage Notes (Signed)
Pt to ED via EMS from [facility] with c/o CP, pt hx of 4 MI and 6 stents. Pt received 324 Asprin and 2 subling nitro, denies any CP at this time. VS stable.

## 2016-10-23 NOTE — ED Triage Notes (Signed)
Patient started having chest pain and SOB 1 hour before arrival. Patient reports having 4 previous heart attacks.

## 2016-10-23 NOTE — ED Notes (Signed)
Patient started on 2L of O2 via nasal cannula

## 2016-10-23 NOTE — ED Provider Notes (Signed)
[facility] Emergency Department Provider Note  ____________________________________________   First MD Initiated Contact with Patient 10/23/16 1833     (approximate)  I have reviewed the triage vital signs and the nursing notes.   HISTORY  Chief Complaint Chest Pain   HPI [name] is a 46 y.o. male with a history of multiple MIs and multiple stents who is presenting to the emergency department today with left-sided chest pain which radiated into his left flank. He says the pain started about noon today when he was making lunch. He says he took a nap and when he woke up the pain was still present. He says the pain was a 5 out of 10 at that time and of a stabbing type quality. He says it was associated with shortness of breath. He says that this felt different from his previous heart attacks where the pain radiated into his left arm.  He initially presented to urgent care and was sent to the emergency department after receiving 3 nitroglycerin as well as aspirin. He says his pain is a 1-2 at this time. Says that he is compliant with his aspirin and Plavix.    Past Medical History:  Diagnosis Date  . Diabetes mellitus without complication ([place])   . GERD (gastroesophageal reflux disease)   . High cholesterol   . Hypertension   . Myocardial infarct [facility])     Patient Active Problem List   Diagnosis Date Noted  . HYPERLIPIDEMIA 12/08/2007  . HYPERTENSION 12/08/2007  . MYOCARDIAL INFARCTION, HX OF 12/08/2007  . CORONARY ARTERY DISEASE 12/08/2007  . VENOUS INSUFFICIENCY, CHRONIC 10/06/2007  . OBESITY NOS 01/19/2007  . GASTROESOPHAGEAL REFLUX DISEASE 10/16/2006    Past Surgical History:  Procedure Laterality Date  . STENT PLACEMENT ILIAC ([place] HX)      Prior to Admission medications   Medication Sig Start Date End Date Taking? Authorizing Provider  aspirin 325 MG tablet Take 325 mg by mouth daily.     Yes [provider]    atorvastatin (LIPITOR) 20 MG tablet Take 1 tablet (20 mg total) by mouth daily. Patient taking differently: Take 40 mg by mouth daily.  07/05/16 10/23/16 Yes Amyot, Nicholes Stairs, NP  clopidogrel (PLAVIX) 75 MG tablet Take 75 mg by mouth daily. 10/23/16  Yes [provider]  furosemide (LASIX) 20 MG tablet Take 1 tablet (20 mg total) by mouth daily. 07/05/16  Yes Amyot, Nicholes Stairs, NP  lisinopril (PRINIVIL,ZESTRIL) 10 MG tablet Take 1 tablet (10 mg total) by mouth daily. 07/05/16 10/23/16 Yes Amyot, Nicholes Stairs, NP  metFORMIN (GLUCOPHAGE-XR) 500 MG 24 hr tablet Take 500 mg by mouth daily. 09/23/16  Yes [provider]  metoprolol tartrate (LOPRESSOR) 25 MG tablet Take 12.5 mg by mouth 2 (two) times daily.  10/23/16  Yes [provider]  pantoprazole (PROTONIX) 20 MG tablet Take 1 tablet (20 mg total) by mouth daily. 07/05/16 10/23/16 Yes Amyot, Nicholes Stairs, NP  diclofenac (VOLTAREN) 75 MG EC tablet Take 1 tablet (75 mg total) by mouth 2 (two) times daily as needed for moderate pain. Patient not taking: Reported on 10/23/2016 07/05/16   Katy Apo, NP  metoprolol succinate (TOPROL-XL) 50 MG 24 hr tablet Take 1/2 tablet twice a day Patient not taking: Reported on 10/23/2016 07/05/16   Katy Apo, NP    Allergies Patient has no known allergies.  No family history on file.  Social History Social History  Substance Use Topics  . Smoking  status: Current Every Day Smoker    Packs/day: 1.00    Types: Cigarettes  . Smokeless tobacco: Never Used  . Alcohol use No    Review of Systems  Constitutional: No fever/chills Eyes: No visual changes. ENT: No sore throat. Cardiovascular: as above Respiratory: as above Gastrointestinal: No abdominal pain.   No nausea, no vomiting.  No diarrhea.  No constipation. Genitourinary: Negative for dysuria. Musculoskeletal: Negative for back pain. Skin: Negative for rash. Neurological: Negative for headaches, focal weakness or  numbness.   ____________________________________________   PHYSICAL EXAM:  VITAL SIGNS: ED Triage Vitals  Enc Vitals Group     BP 10/23/16 1834 108/71     Pulse Rate 10/23/16 1832 63     Resp 10/23/16 1832 16     Temp 10/23/16 1834 98.7 F (37.1 C)     Temp Source 10/23/16 1834 Oral     SpO2 10/23/16 1832 94 %     Weight 10/23/16 1832 221 lb (100.2 kg)     Height 10/23/16 1832 5' 10"  (1.778 m)     Head Circumference --      Peak Flow --      Pain Score 10/23/16 1831 0     Pain Loc --      Pain Edu? --      Excl. in [place]? --     Constitutional: Alert and oriented. Well appearing and in no acute distress. Eyes: Conjunctivae are normal.  Head: Atraumatic. Nose: No congestion/rhinnorhea. Mouth/Throat: Mucous membranes are moist.  Neck: No stridor.   Cardiovascular: Normal rate, regular rhythm. Grossly normal heart sounds.  Chest pain is not reproducible to palpation. Respiratory: Normal respiratory effort.  No retractions. Lungs CTAB. Gastrointestinal: Soft and nontender. No distention. No CVA tenderness. Musculoskeletal: No lower extremity tenderness nor edema.  No joint effusions. Neurologic:  Normal speech and language. No gross focal neurologic deficits are appreciated. Skin:  Skin is warm, dry and intact. No rash noted. Psychiatric: Mood and affect are normal. Speech and behavior are normal.  ____________________________________________   LABS (all labs ordered are listed, but only abnormal results are displayed)  Labs Reviewed  BASIC METABOLIC PANEL - Abnormal; Notable for the following:       Result Value   Potassium 3.3 (*)    Calcium 8.3 (*)    All other components within normal limits  CBC  TROPONIN I  TROPONIN I   ____________________________________________  EKG  ED ECG REPORT I, Doran Stabler, the attending physician, personally viewed and interpreted this ECG.   Date: 10/23/2016  EKG Time: 1832  Rate: 68  Rhythm: normal sinus rhythm   Axis: Normal  Intervals:none  ST&T Change: No ST segment elevation or depression. No abnormal T-wave inversion.  ____________________________________________  RADIOLOGY  No focal consolidation. Possible mild interstitial edema. Multivessel coronary vascular calcification which is advanced for the patient's age. ____________________________________________   PROCEDURES  Procedure(s) performed:   Procedures  Critical Care performed:   ____________________________________________   INITIAL IMPRESSION / ASSESSMENT AND PLAN / ED COURSE  Pertinent labs & imaging results that were available during my care of the patient were reviewed by me and considered in my medical decision making (see chart for details).  ----------------------------------------- 8:26 PM on 10/23/2016 -----------------------------------------  Patient is pain-free after morphine. We discussed admission to the hospital but the patient says that he would rather be discharged home. We discussed the risks of being discharged, especially given the patient's history of cardiac disease and stenting. However, the  patient understands the risk of death, permanent disability or worsening of his condition. He says that he will return immediately for any worsening or concerning symptoms. He is not clinically intoxicated. He has decisional capacity and insight into his medical conditions. Pending second troponin at this time. Signed out to Dr. Jimmye Norman.      ____________________________________________   FINAL CLINICAL IMPRESSION(S) / ED DIAGNOSES  Chest pain.    NEW MEDICATIONS STARTED DURING THIS VISIT:  New Prescriptions   No medications on file     Note:  This document was prepared using Dragon voice recognition software and may include unintentional dictation errors.     Orbie Pyo, MD 10/23/16 2028

## 2017-06-23 IMAGING — CR DG KNEE COMPLETE 4+V*R*
1 series · 4 of 4 positions shown · non-contrast
Comparison: Report 02/08/1999 no images available

CLINICAL DATA: Fell at work today onto right knee, anterior knee
pain

EXAM:
RIGHT KNEE - COMPLETE 4+ VIEW

[Series 1: dg knee complete 4 views right · 0.14mm/px · 4 of 4 slices shown]
[im 1/4]
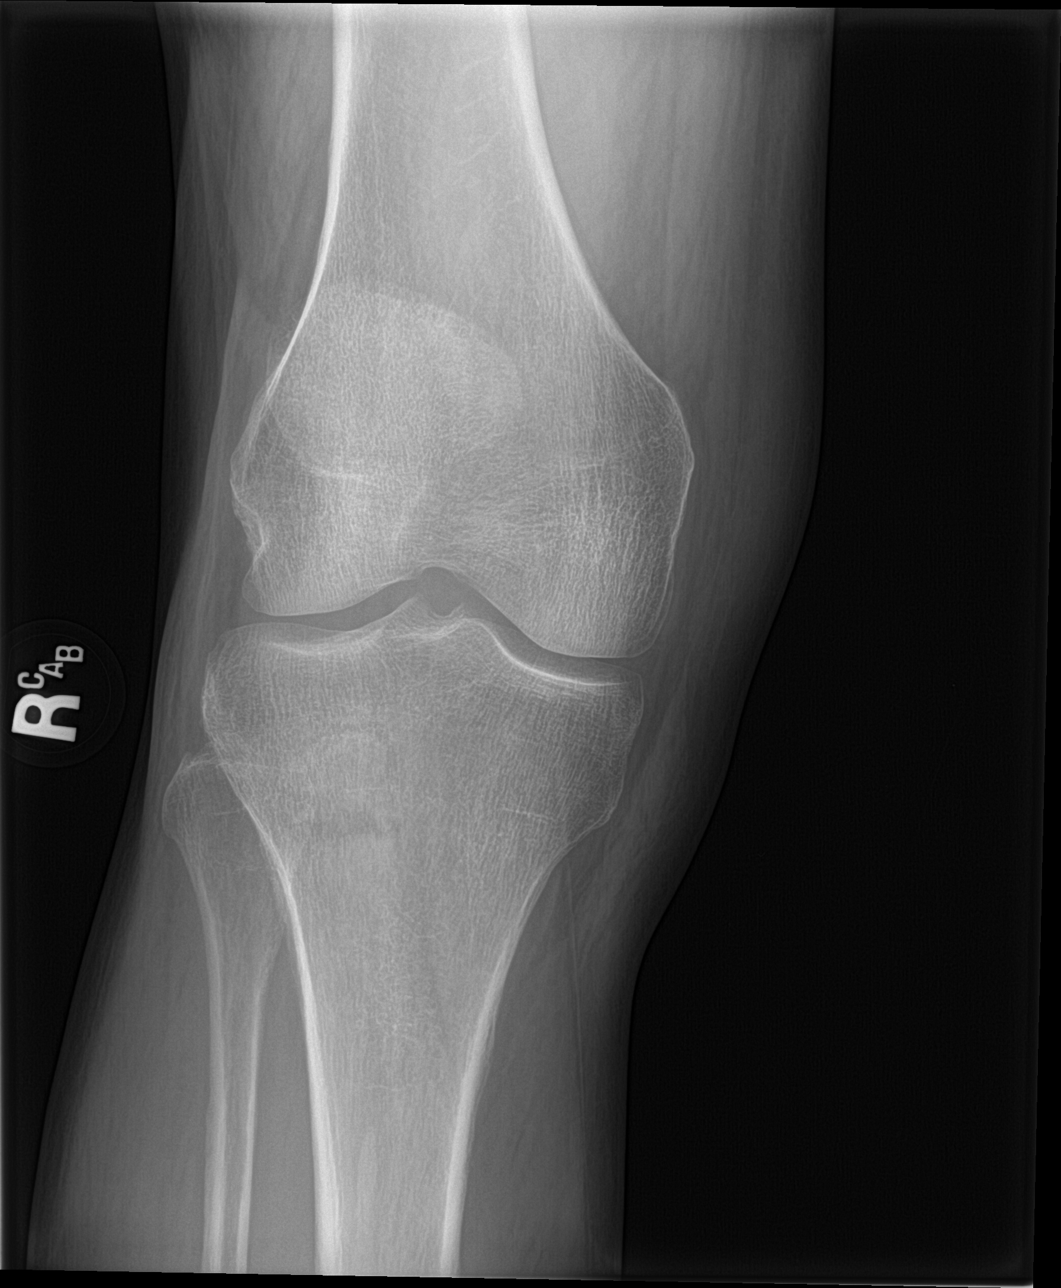
[im 2/4]
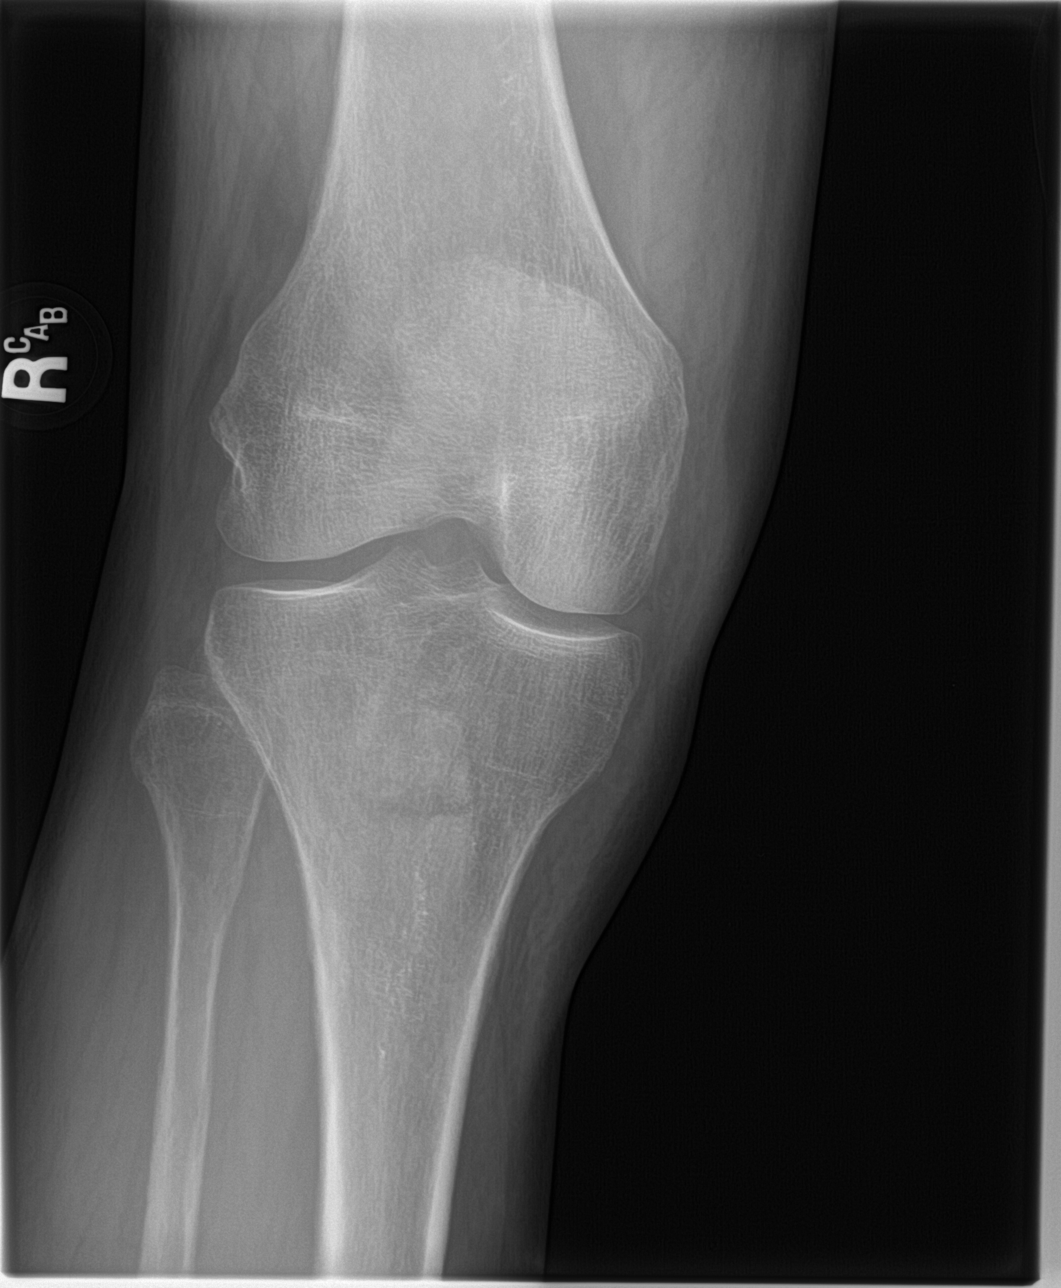
[im 3/4]
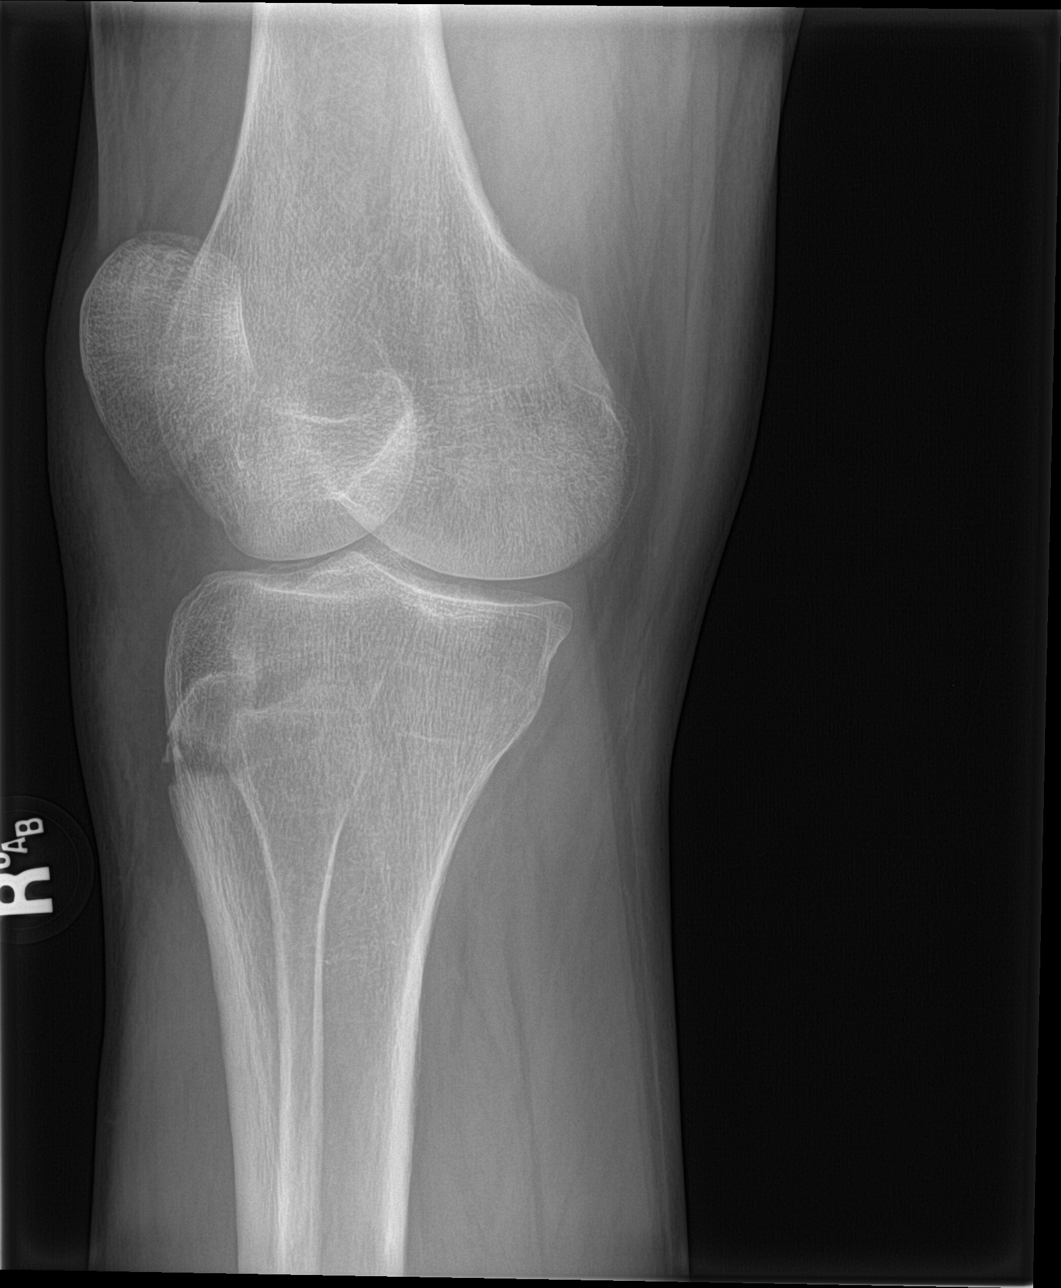
[im 4/4]
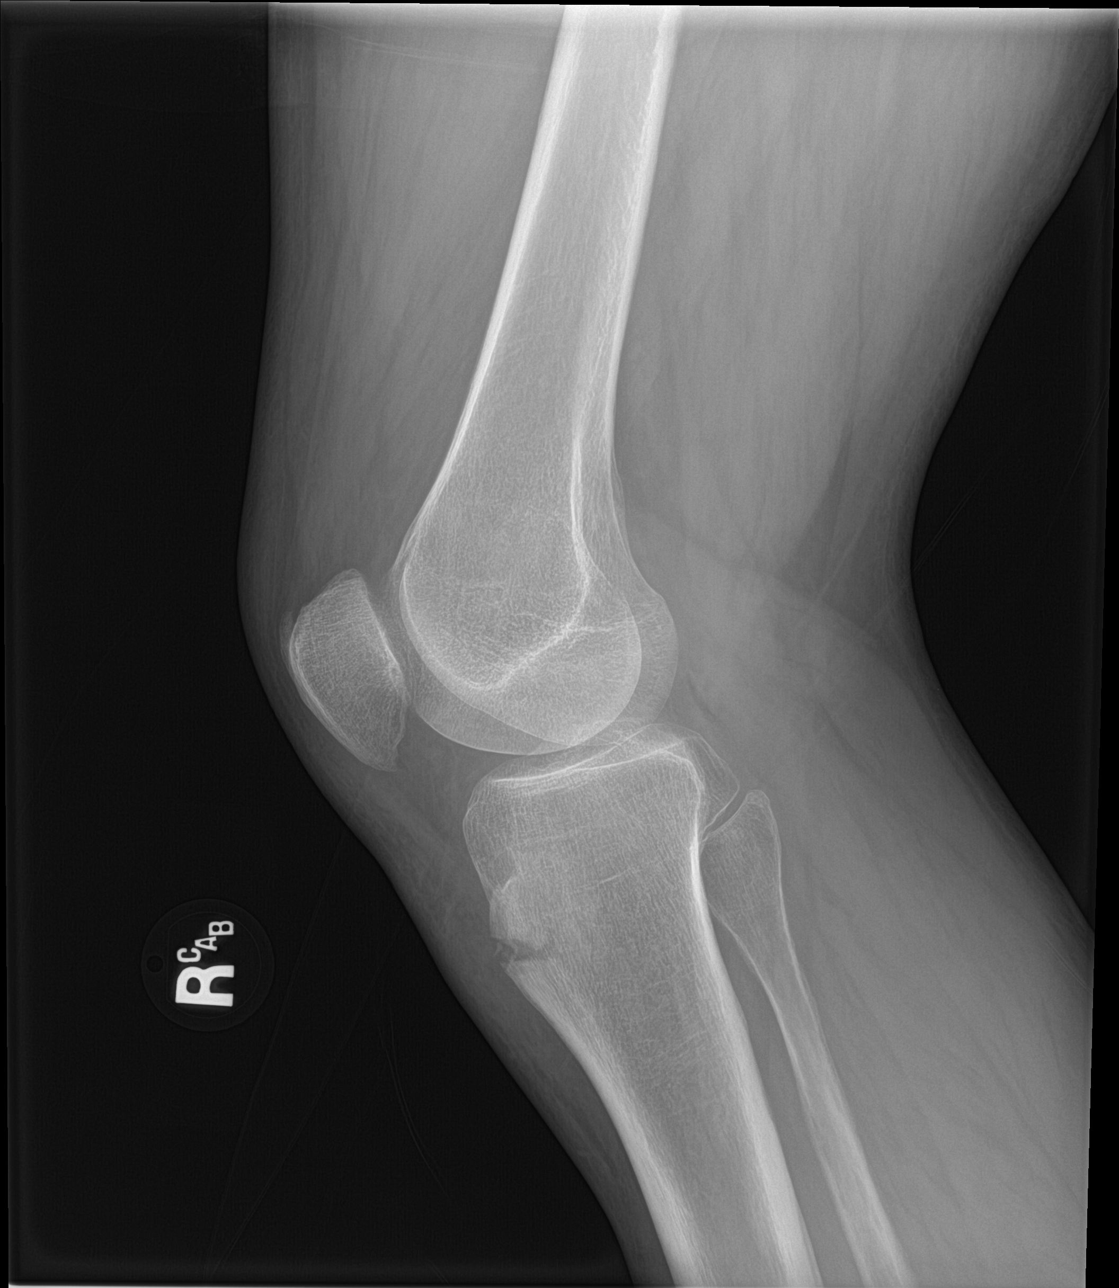

[4 of 4 positions shown; findings below may reference images not displayed]

FINDINGS: Four views of the right knee submitted. Small joint effusion. Mild
spurring of patella. There is nondisplaced transverse fracture of
tibial tuberosity. Adjacent soft tissue swelling.
IMPRESSION: There is nondisplaced transverse fracture of tibial tuberosity with
adjacent soft tissue swelling

## 2019-03-14 ENCOUNTER — Other Ambulatory Visit: Payer: Self-pay

## 2019-03-14 DIAGNOSIS — Z5321 Procedure and treatment not carried out due to patient leaving prior to being seen by health care provider: Secondary | ICD-10-CM | POA: Insufficient documentation

## 2019-03-14 DIAGNOSIS — R079 Chest pain, unspecified: Secondary | ICD-10-CM | POA: Insufficient documentation

## 2019-03-14 NOTE — ED Triage Notes (Signed)
Est pain started at 2230, took 3 nitro no relief, last MI was 1 year ago, pain is midsternal and radiating down the left arm.

## 2019-03-15 ENCOUNTER — Emergency Department
Admission: EM | Admit: 2019-03-15 | Discharge: 2019-03-15 | Disposition: A | Discharge status: Home / Self Care | Payer: Self-pay | Attending: Emergency Medicine | Admitting: Emergency Medicine

## 2019-03-15 ENCOUNTER — Telehealth: Payer: Self-pay | Admitting: Emergency Medicine

## 2019-03-15 ENCOUNTER — Emergency Department: Payer: Self-pay

## 2019-03-15 LAB — TROPONIN I (HIGH SENSITIVITY)
Troponin I (High Sensitivity): 33 ng/L — ABNORMAL HIGH (ref <18)
Troponin I (High Sensitivity): 6 ng/L (ref <18)

## 2019-03-15 LAB — CBC
HCT: 44.2 % (ref 39.0–52.0)
Hemoglobin: 15.9 g/dL (ref 13.0–17.0)
MCH: 31.4 pg (ref 26.0–34.0)
MCHC: 36 g/dL (ref 30.0–36.0)
MCV: 87.2 fL (ref 80.0–100.0)
Platelets: 239 10*3/uL (ref 150–400)
RBC: 5.07 MIL/uL (ref 4.22–5.81)
RDW: 12.6 % (ref 11.5–15.5)
WBC: 8.1 10*3/uL (ref 4.0–10.5)
nRBC: 0 % (ref 0.0–0.2)

## 2019-03-15 LAB — GLUCOSE, CAPILLARY: Glucose-Capillary: 333 mg/dL — ABNORMAL HIGH (ref 70–99)

## 2019-03-15 LAB — BASIC METABOLIC PANEL
Anion gap: 9 (ref 5–15)
BUN: 11 mg/dL (ref 6–20)
CO2: 24 mmol/L (ref 22–32)
Calcium: 9.3 mg/dL (ref 8.9–10.3)
Chloride: 102 mmol/L (ref 98–111)
Creatinine, Ser: 0.88 mg/dL (ref 0.61–1.24)
GFR calc Af Amer: >60 mL/min (ref >60)
GFR calc non Af Amer: >60 mL/min (ref >60)
Glucose, Bld: 300 mg/dL — ABNORMAL HIGH (ref 70–99)
Potassium: 4 mmol/L (ref 3.5–5.1)
Sodium: 135 mmol/L (ref 135–145)

## 2019-03-15 MED ORDER — SODIUM CHLORIDE 0.9% FLUSH: 3.0000 mL | Freq: Once | INTRAVENOUS | Status: DC

## 2019-03-15 NOTE — ED Notes (Signed)
Pt reports to RN and EDT that he has been "popping" troponin in lobby.

## 2019-03-15 NOTE — Telephone Encounter (Signed)
Called patient due to lwot to inquire about condition and follow up plans. Patient says he is planning on going possibly to [place] as he is still symptomatic.  Explained elevated troponin and that he is strongly encouraged to be seen.

## 2019-03-15 NOTE — ED Notes (Addendum)
Pt not in lobby when called x3.   Attempted to call pt by phone and unable to reach. Charge RN made aware.

## 2019-08-18 ENCOUNTER — Encounter: Payer: Managed Care, Other (non HMO) | Attending: Internal Medicine | Admitting: *Deleted

## 2019-08-18 ENCOUNTER — Other Ambulatory Visit: Payer: Self-pay

## 2019-08-18 DIAGNOSIS — I214 Non-ST elevation (NSTEMI) myocardial infarction: Secondary | ICD-10-CM

## 2019-08-18 NOTE — Progress Notes (Signed)
Confirm Consent - "You are scheduled to join our "At Home" [facility]  Cardiac or Pulmonary  Rehab program . Just as we do with many in-gym visits, in order for you to participate in this program, we must obtain consent.  If you'd like, I can send this to your mychart (if signed up) or email for you to review.  Otherwise, I can obtain your verbal consent now.  By agreeing to a Cardiac or Pulmonary Rehab Telehealth visit, we'd like you to understand that the technology does not allow for your Cardiac or Pulmonary Rehab team member to perform a physical assessment, and thus may limit their ability to fully assess your ability to perform exercise programs. If your provider identifies any concerns that need to be evaluated in person, we will make arrangements to do so.  Finally, though the technology is pretty good, we cannot assure that it will always work on either your or our end and we cannot ensure that we have a secure connection.  Cardiac and Pulmonary Rehab Telehealth visits and "At Home" cardiac and pulmonary rehab are provided at no cost to you. Are you willing to proceed?" STAFF: Did the patient verbally acknowledge consent to telehealth visit? Document YES/NO here:  YES/NO: Yes   Staff completing consent process: Alberteen Sam, MA, [place], CCRP 08/18/2019 11:32 AM    Email: [email or web address] Phone: [phone] Diagnosis: 08/02/19 NSTEMI CONSENT COMPLETED: YES/NO: Yes  Risk Stratification: moderate Risk Factors:  Causes; risk factors atherosclerosis: arteriosclerotic heart disease, diabetes mellitus, hypercholesterolemia, hypertension, smoking Current Exercise: none Patient Exercise Barriers: SOB, bending over causes chest pain/pressure, walking can cause chest pain Mobility Assistive Device at Home: none Vital Sign Devices at Home: Blood Pressure Cuff: YES/NO: Yes                                                Scale: YES/NO: Yes                                                Pulse Oximeter:  YES/NO: No  Exercise Equipment at Home:  none   To use [employer]: YES/NO: Yes              Entered on Dashboard: YES/NO: Yes              SMS sent with invite: YES/NO: Yes    Next Appt Scheduled: 08/23/19 HE review

## 2019-08-23 ENCOUNTER — Encounter: Payer: Managed Care, Other (non HMO) | Admitting: *Deleted

## 2019-08-23 ENCOUNTER — Other Ambulatory Visit: Payer: Self-pay

## 2019-08-23 DIAGNOSIS — I214 Non-ST elevation (NSTEMI) myocardial infarction: Secondary | ICD-10-CM

## 2019-08-23 NOTE — Progress Notes (Signed)
Cardiac Individual Treatment Plan  Patient Details  Name: [name] MRN: [id number] Date of Birth: 1971/01/11 Referring Provider:     Cardiac Rehab from 08/23/2019 in [facility] Cardiac and Pulmonary Rehab  Referring Provider  Kandis Cocking MD      Initial Encounter Date:    Cardiac Rehab from 08/23/2019 in [facility] Cardiac and Pulmonary Rehab  Date  08/23/19      Visit Diagnosis: NSTEMI (non-ST elevated myocardial infarction) [facility])  Patient's Home Medications on Admission:  Current Outpatient Medications:  .  amoxicillin (AMOXIL) 875 MG tablet, Take by mouth., Disp: , Rfl:  .  [START ON 09/02/2019] apixaban (ELIQUIS) 5 MG TABS tablet, Take by mouth., Disp: , Rfl:  .  aspirin 325 MG tablet, Take 325 mg by mouth daily.  , Disp: , Rfl:  .  atorvastatin (LIPITOR) 20 MG tablet, Take 1 tablet (20 mg total) by mouth daily. (Patient taking differently: Take 40 mg by mouth daily. ), Disp: 30 tablet, Rfl: 0 .  clopidogrel (PLAVIX) 75 MG tablet, Take 75 mg by mouth daily., Disp: , Rfl:  .  diclofenac (VOLTAREN) 75 MG EC tablet, Take 1 tablet (75 mg total) by mouth 2 (two) times daily as needed for moderate pain. (Patient not taking: Reported on 10/23/2016), Disp: 20 tablet, Rfl: 0 .  famotidine (PEPCID) 20 MG tablet, Take by mouth., Disp: , Rfl:  .  furosemide (LASIX) 20 MG tablet, Take 1 tablet (20 mg total) by mouth daily. (Patient not taking: Reported on 08/18/2019), Disp: 30 tablet, Rfl: 0 .  lisinopril (PRINIVIL,ZESTRIL) 10 MG tablet, Take 1 tablet (10 mg total) by mouth daily., Disp: 30 tablet, Rfl: 0 .  metFORMIN (GLUCOPHAGE-XR) 500 MG 24 hr tablet, Take 500 mg by mouth daily., Disp: , Rfl:  .  metoprolol succinate (TOPROL-XL) 50 MG 24 hr tablet, Take 1/2 tablet twice a day, Disp: 30 tablet, Rfl: 0 .  metoprolol tartrate (LOPRESSOR) 25 MG tablet, Take 12.5 mg by mouth 2 (two) times daily. , Disp: , Rfl:  .  nicotine (NICODERM CQ - DOSED IN MG/24 HOURS) 21 mg/24hr patch, Place onto the skin., Disp:  , Rfl:  .  nitroGLYCERIN (NITROSTAT) 0.4 MG SL tablet, Place 1 tablet (0.4 mg total) under the tongue every 5 (five) minutes as needed for chest pain., Disp: 100 tablet, Rfl: 3 .  pantoprazole (PROTONIX) 20 MG tablet, Take 1 tablet (20 mg total) by mouth daily., Disp: 30 tablet, Rfl: 0 .  sacubitril-valsartan (ENTRESTO) 24-26 MG, Take by mouth., Disp: , Rfl:  .  sitaGLIPtin (JANUVIA) 50 MG tablet, Take by mouth., Disp: , Rfl:  .  ticagrelor (BRILINTA) 90 MG TABS tablet, Take by mouth., Disp: , Rfl:   Past Medical History: Past Medical History:  Diagnosis Date  . Diabetes mellitus without complication ([place])   . GERD (gastroesophageal reflux disease)   . High cholesterol   . Hypertension   . Myocardial infarct ([facility])     Tobacco Use: Social History   Tobacco Use  Smoking Status Current Every Day Smoker  . Packs/day: 1.00  . Types: Cigarettes  Smokeless Tobacco Never Used    Labs: Recent Review Flowsheet Data    Labs for ITP Cardiac and Pulmonary Rehab 10/27/2006 11/30/2006   Cholestrol 131 ATP III CLASSIFICATION: <200     mg/dL   Desirable 200-239  mg/dL   Borderline High >=240    mg/dL   High 129 ATP III CLASSIFICATION: <200     mg/dL   Desirable 200-239  mg/dL   Borderline High >=240    mg/dL   High   LDLCALC 63 Total Cholesterol/HDL:CHD Risk Coronary Heart Disease Risk Table Men   Women 1/2 Average Risk   3.4   3.3 50 Total Cholesterol/HDL:CHD Risk Coronary Heart Disease Risk Table Men   Women 1/2 Average Risk   3.4   3.3   HDL 35(L) 30(L)   Trlycerides 164(H) 244(H)   Hemoglobin A1c 5.8 (NOTE)   The ADA recommends the following therapeutic goals for glycemic   control related to Hgb A1C measurement:   Goal of Therapy:   < 7.0% Hgb A1C   Action Suggested:  > 8.0% Hgb A1C   Ref:  Diabetes Care, 22, Suppl. 1, 1999 -       Exercise Target Goals: Exercise Program Goal: Individual exercise prescription set using results from initial 6 min walk test and THRR while  considering  patient's activity barriers and safety.   Exercise Prescription Goal: Initial exercise prescription builds to 30-45 minutes a day of aerobic activity, 2-3 days per week.  Home exercise guidelines will be given to patient during program as part of exercise prescription that the participant will acknowledge.   Education: Aerobic Exercise & Resistance Training: - Gives group verbal and written instruction on the various components of exercise. Focuses on aerobic and resistive training programs and the benefits of this training and how to safely progress through these programs..   Education: Exercise & Equipment Safety: - Individual verbal instruction and demonstration of equipment use and safety with use of the equipment.   Education: Exercise Physiology & General Exercise Guidelines: - Group verbal and written instruction with models to review the exercise physiology of the cardiovascular system and associated critical values. Provides general exercise guidelines with specific guidelines to those with heart or lung disease.    Education: Flexibility, Balance, Mind/Body Relaxation: Provides group verbal/written instruction on the benefits of flexibility and balance training, including mind/body exercise modes such as yoga, pilates and tai chi.  Demonstration and skill practice provided.   Activity Barriers & Risk Stratification:   6 Minute Walk:   Oxygen Initial Assessment:   Oxygen Re-Evaluation:   Oxygen Discharge (Final Oxygen Re-Evaluation):   Initial Exercise Prescription: Initial Exercise Prescription - 08/23/19 1500      Date of Initial Exercise RX and Referring Provider   Date  08/23/19    Referring Provider  Kandis Cocking MD      Track   Minutes  30   building to 30 min     Prescription Details   Frequency (times per week)  3    Duration  Progress to 30 minutes of continuous aerobic without signs/symptoms of physical distress      Intensity   THRR  40-80% of Max Heartrate  116-154    Ratings of Perceived Exertion  11-13    Perceived Dyspnea  0-4      Progression   Progression  Continue to progress workloads to maintain intensity without signs/symptoms of physical distress.      Resistance Training   Training Prescription  Yes    Weight  ROM, body weight    Reps  10-15       Perform Capillary Blood Glucose checks as needed.  Exercise Prescription Changes:   Exercise Comments:   Exercise Goals and Review:  Exercise Goals    Row Name 08/23/19 1530             Exercise Goals   Increase Physical Activity  Yes       Intervention  Provide advice, education, support and counseling about physical activity/exercise needs.;Develop an individualized exercise prescription for aerobic and resistive training based on initial evaluation findings, risk stratification, comorbidities and participant's personal goals.       Expected Outcomes  Short Term: Attend rehab on a regular basis to increase amount of physical activity.;Long Term: Add in home exercise to make exercise part of routine and to increase amount of physical activity.;Long Term: Exercising regularly at least 3-5 days a week.       Increase Strength and Stamina  Yes       Intervention  Provide advice, education, support and counseling about physical activity/exercise needs.;Develop an individualized exercise prescription for aerobic and resistive training based on initial evaluation findings, risk stratification, comorbidities and participant's personal goals.       Expected Outcomes  Short Term: Increase workloads from initial exercise prescription for resistance, speed, and METs.;Short Term: Perform resistance training exercises routinely during rehab and add in resistance training at home;Long Term: Improve cardiorespiratory fitness, muscular endurance and strength as measured by increased METs and functional capacity (6MWT)       Able to understand and use rate of perceived  exertion (RPE) scale  Yes       Intervention  Provide education and explanation on how to use RPE scale       Expected Outcomes  Short Term: Able to use RPE daily in rehab to express subjective intensity level;Long Term:  Able to use RPE to guide intensity level when exercising independently       Able to understand and use Dyspnea scale  Yes       Intervention  Provide education and explanation on how to use Dyspnea scale       Expected Outcomes  Short Term: Able to use Dyspnea scale daily in rehab to express subjective sense of shortness of breath during exertion;Long Term: Able to use Dyspnea scale to guide intensity level when exercising independently       Knowledge and understanding of Target Heart Rate Range (THRR)  Yes       Intervention  Provide education and explanation of THRR including how the numbers were predicted and where they are located for reference       Expected Outcomes  Short Term: Able to state/look up THRR;Long Term: Able to use THRR to govern intensity when exercising independently;Short Term: Able to use daily as guideline for intensity in rehab       Able to check pulse independently  Yes       Intervention  Provide education and demonstration on how to check pulse in carotid and radial arteries.;Review the importance of being able to check your own pulse for safety during independent exercise       Expected Outcomes  Short Term: Able to explain why pulse checking is important during independent exercise;Long Term: Able to check pulse independently and accurately       Understanding of Exercise Prescription  Yes       Intervention  Provide education, explanation, and written materials on patient's individual exercise prescription       Expected Outcomes  Short Term: Able to explain program exercise prescription;Long Term: Able to explain home exercise prescription to exercise independently          Exercise Goals Re-Evaluation : Exercise Goals Re-Evaluation    [place] Name  08/23/19 1530  Exercise Goal Re-Evaluation   Exercise Goals Review  Increase Physical Activity;Increase Strength and Stamina;Able to understand and use rate of perceived exertion (RPE) scale;Able to understand and use Dyspnea scale;Knowledge and understanding of Target Heart Rate Range (THRR);Able to check pulse independently;Understanding of Exercise Prescription       Comments  Reviewed virtual home exercise guidelines today.       Expected Outcomes  Short: Continue to exercise 3x a week for 15 min and build to 30 min  Long: Continue to build stamina.          Discharge Exercise Prescription (Final Exercise Prescription Changes):   Nutrition:  Target Goals: Understanding of nutrition guidelines, daily intake of sodium <1513m, cholesterol <2074m calories 30% from fat and 7% or less from saturated fats, daily to have 5 or more servings of fruits and vegetables.  Education: Controlling Sodium/Reading Food Labels -Group verbal and written material supporting the discussion of sodium use in heart healthy nutrition. Review and explanation with models, verbal and written materials for utilization of the food label.   Education: General Nutrition Guidelines/Fats and Fiber: -Group instruction provided by verbal, written material, models and posters to present the general guidelines for heart healthy nutrition. Gives an explanation and review of dietary fats and fiber.   Biometrics:    Nutrition Therapy Plan and Nutrition Goals:   Nutrition Assessments:   MEDIFICTS Score Key:          ?70 Need to make dietary changes          40-70 Heart Healthy Diet         ? 40 Therapeutic Level Cholesterol Diet  Nutrition Goals Re-Evaluation:   Nutrition Goals Discharge (Final Nutrition Goals Re-Evaluation):   Psychosocial: Target Goals: Acknowledge presence or absence of significant depression and/or stress, maximize coping skills, provide positive support system. Participant  is able to verbalize types and ability to use techniques and skills needed for reducing stress and depression.   Education: Depression - Provides group verbal and written instruction on the correlation between heart/lung disease and depressed mood, treatment options, and the stigmas associated with seeking treatment.   Education: Sleep Hygiene -Provides group verbal and written instruction about how sleep can affect your health.  Define sleep hygiene, discuss sleep cycles and impact of sleep habits. Review good sleep hygiene tips.     Education: Stress and Anxiety: - Provides group verbal and written instruction about the health risks of elevated stress and causes of high stress.  Discuss the correlation between heart/lung disease and anxiety and treatment options. Review healthy ways to manage with stress and anxiety.    Initial Review & Psychosocial Screening:   Quality of Life Scores:   Scores of 19 and below usually indicate a poorer quality of life in these areas.  A difference of  2-3 points is a clinically meaningful difference.  A difference of 2-3 points in the total score of the Quality of Life Index has been associated with significant improvement in overall quality of life, self-image, physical symptoms, and general health in studies assessing change in quality of life.  PHQ-9: Recent Review Flowsheet Data    There is no flowsheet data to display.     Interpretation of Total Score  Total Score Depression Severity:  1-4 = Minimal depression, 5-9 = Mild depression, 10-14 = Moderate depression, 15-19 = Moderately severe depression, 20-27 = Severe depression   Psychosocial Evaluation and Intervention:   Psychosocial Re-Evaluation:   Psychosocial Discharge (Final Psychosocial Re-Evaluation):  Vocational Rehabilitation: Provide vocational rehab assistance to qualifying candidates.   Vocational Rehab Evaluation & Intervention:   Education: Education Goals:  Education classes will be provided on a variety of topics geared toward better understanding of heart health and risk factor modification. Participant will state understanding/return demonstration of topics presented as noted by education test scores.  Learning Barriers/Preferences:   General Cardiac Education Topics:  AED/CPR: - Group verbal and written instruction with the use of models to demonstrate the basic use of the AED with the basic ABC's of resuscitation.   Anatomy & Physiology of the Heart: - Group verbal and written instruction and models provide basic cardiac anatomy and physiology, with the coronary electrical and arterial systems. Review of Valvular disease and Heart Failure   Cardiac Procedures: - Group verbal and written instruction to review commonly prescribed medications for heart disease. Reviews the medication, class of the drug, and side effects. Includes the steps to properly store meds and maintain the prescription regimen. (beta blockers and nitrates)   Cardiac Medications I: - Group verbal and written instruction to review commonly prescribed medications for heart disease. Reviews the medication, class of the drug, and side effects. Includes the steps to properly store meds and maintain the prescription regimen.   Cardiac Medications II: -Group verbal and written instruction to review commonly prescribed medications for heart disease. Reviews the medication, class of the drug, and side effects. (all other drug classes)    Go Sex-Intimacy & Heart Disease, Get SMART - Goal Setting: - Group verbal and written instruction through game format to discuss heart disease and the return to sexual intimacy. Provides group verbal and written material to discuss and apply goal setting through the application of the S.M.A.R.T. Method.   Other Matters of the Heart: - Provides group verbal, written materials and models to describe Stable Angina and Peripheral Artery.  Includes description of the disease process and treatment options available to the cardiac patient.   Infection Prevention: - Provides verbal and written material to individual with discussion of infection control including proper hand washing and proper equipment cleaning during exercise session.   Falls Prevention: - Provides verbal and written material to individual with discussion of falls prevention and safety.   Other: -Provides group and verbal instruction on various topics (see comments)   Knowledge Questionnaire Score:   Core Components/Risk Factors/Patient Goals at Admission:   Education:Diabetes - Individual verbal and written instruction to review signs/symptoms of diabetes, desired ranges of glucose level fasting, after meals and with exercise. Acknowledge that pre and post exercise glucose checks will be done for 3 sessions at entry of program.   Education: Know Your Numbers and Risk Factors: -Group verbal and written instruction about important numbers in your health.  Discussion of what are risk factors and how they play a role in the disease process.  Review of Cholesterol, Blood Pressure, Diabetes, and BMI and the role they play in your overall health.   Core Components/Risk Factors/Patient Goals Review:  Goals and Risk Factor Review    Row Name 08/23/19 1532             Core Components/Risk Factors/Patient Goals Review   Personal Goals Review  Tobacco Cessation       Review  Sent fake cigarette       Expected Outcomes  work towards quitting smoking          Core Components/Risk Factors/Patient Goals at Discharge (Final Review):  Goals and Risk Factor Review - 08/23/19 1532  Core Components/Risk Factors/Patient Goals Review   Personal Goals Review  Tobacco Cessation    Review  Sent fake cigarette    Expected Outcomes  work towards quitting smoking       ITP Comments: ITP Comments    Row Name 08/23/19 1530           ITP Comments   Completed virtual home exercise guideline appointment.  Next appt scheduled for 5/11 at 3pm.          Comments: Virtual Home Exercise Guidelines

## 2019-08-23 NOTE — Progress Notes (Signed)
Completed virtual home exercise guideline appointment.  Next appt scheduled for 5/11 at 3pm.   Starting out your exercise session should last for 30 minutes for 5-7 days a week.    Your progression for home exercise is:  Start at 15 min 3 days a week consistently. Then begin to add a minute each day until you can do 30 min 3 days a week.  Continue at this level for 1-2 weeks then begin to add in your 4th and 5th days.  Another alternative will be to start with one round of videos (moderate intensity) and then add in the second round to reach your 30 min.  You can also combine the videos with your walking too.   Resistance Training: Start with 6-8 exercises for 12 reps each or follow along with a video. Exercises can be found in packet that will be mailed to you.   Exercise at a comfortable pace, using your heart rate and rate of perceived exertion as guides for intensity.  Your target heart rate range is 116-154.

## 2019-09-13 ENCOUNTER — Encounter: Payer: Managed Care, Other (non HMO) | Attending: Internal Medicine | Admitting: *Deleted

## 2019-09-13 ENCOUNTER — Other Ambulatory Visit: Payer: Self-pay

## 2019-09-13 DIAGNOSIS — I214 Non-ST elevation (NSTEMI) myocardial infarction: Secondary | ICD-10-CM

## 2019-09-13 NOTE — Progress Notes (Signed)
Completed virtual appointment today.  Next follow up scheduled for 5/25 at 3pm.  [name] is off to a good start.  He is walking some at the stores to be inside and have a place to rest.  He is still getting some chest pain and SOB whenever he exerts.  The chest pain has been right sided more than the left like before MI.  He really wants to get back to going again.  He has a follow up appt next week with his PCP and was encouraged to bring this up in conversation.    [name] is off to a good start.  He is walking some at the stores to be inside and have a place to rest.  He is still getting some chest pain and SOB whenever he exerts.  The chest pain has been right sided more than the left like before MI.  He really wants to get back to going again.  He has a follow up appt next week with his PCP and was encouraged to bring this up in conversation.

## 2019-09-20 ENCOUNTER — Other Ambulatory Visit: Payer: Self-pay

## 2019-09-20 DIAGNOSIS — I214 Non-ST elevation (NSTEMI) myocardial infarction: Secondary | ICD-10-CM

## 2019-09-20 NOTE — Progress Notes (Signed)
Completed initial RD Evaluation.

## 2019-09-27 ENCOUNTER — Other Ambulatory Visit: Payer: Self-pay

## 2019-09-27 ENCOUNTER — Encounter: Payer: Managed Care, Other (non HMO) | Admitting: *Deleted

## 2019-09-27 DIAGNOSIS — I214 Non-ST elevation (NSTEMI) myocardial infarction: Secondary | ICD-10-CM

## 2019-09-27 NOTE — Progress Notes (Signed)
Completed virtual appointment today.  Next appt scheduled for 6/8 at 3pm.  [name] is doing well overall.  He continues to have issues with chest pain and follows up with cardiology next week to discuss uping Imdur dosage to better management.    His A1c has dropped to 7.4!!  And he continues to lose weight and down to 209 lb.  He continues to work on quitting smoking and now working with health department to get patches and lonzenges at no cost!    He enjoyed talking with Lenna Sciara and is now reading labels regularly.   He is walking regularly just not submitting it.  He is also staying more active now and has more strength and stamina.

## 2019-10-11 ENCOUNTER — Other Ambulatory Visit: Payer: Self-pay

## 2019-10-11 ENCOUNTER — Encounter: Payer: Managed Care, Other (non HMO) | Attending: Internal Medicine | Admitting: *Deleted

## 2019-10-11 DIAGNOSIS — I214 Non-ST elevation (NSTEMI) myocardial infarction: Secondary | ICD-10-CM

## 2019-10-11 NOTE — Progress Notes (Signed)
Completed virtual visit today.  Follow and graduation scheduled for 6/22 at 3pm.  [name] is doing well.  He continues to have chest pain with walking. Imdur has been increased, but he is learning to live with pain. He does use his SL NTG as needed still.  He is doing stretching and some resistance work.  He feels that his energy levels are improving, but limited by chest pain.    He is still working on quitting smoking and making strides.  His wife has even commented on how much less he is smoking now.  His weight is still coming down. His is doing better with his blood sugars and blood pressures.  He had a cardiology follow up last week and is doing well. He has an ultrasound scheduled for end of the month to check on his blood clots.

## 2019-10-25 ENCOUNTER — Other Ambulatory Visit: Payer: Self-pay

## 2019-10-25 ENCOUNTER — Encounter: Payer: Managed Care, Other (non HMO) | Admitting: *Deleted

## 2019-10-25 DIAGNOSIS — I214 Non-ST elevation (NSTEMI) myocardial infarction: Secondary | ICD-10-CM

## 2019-10-25 NOTE — Progress Notes (Signed)
Completed virtual graduation.  He plans to continue to monitor his vitals using the app.  He has enjoyed learning through the program. He has an echo next week to check on his clot.   He will return to walking once chest pain and clot resolved.
